# Patient Record
Sex: Male | Born: 1950 | Race: Black or African American | Hispanic: No | Marital: Married | Smoking: Current every day smoker
Health system: Southern US, Community
[De-identification: ages and names within clinical notes are randomized; demographics above are authoritative.]

---

## 2005-09-02 HISTORY — PX: LYSIS OF ADHESION: SHX5961

## 2018-09-02 HISTORY — PX: ABDOMINAL SURGERY: SHX537

## 2019-09-08 ENCOUNTER — Ambulatory Visit (INDEPENDENT_AMBULATORY_CARE_PROVIDER_SITE_OTHER): Payer: Medicare (Managed Care) | Admitting: Gastroenterology

## 2019-09-08 ENCOUNTER — Other Ambulatory Visit: Payer: Self-pay

## 2019-09-08 ENCOUNTER — Encounter (INDEPENDENT_AMBULATORY_CARE_PROVIDER_SITE_OTHER): Payer: Self-pay | Admitting: Gastroenterology

## 2019-09-08 VITALS — BP 172/85 | HR 80 | Temp 97.2°F | Ht 71.0 in | Wt 138.6 lb

## 2019-09-08 DIAGNOSIS — E876 Hypokalemia: Secondary | ICD-10-CM | POA: Diagnosis not present

## 2019-09-08 NOTE — Patient Instructions (Signed)
I am requesting records from [facility].   Your potassium was low at discharge - repeat potassium today, will call w/ results.

## 2019-09-08 NOTE — Progress Notes (Signed)
Patient profile: Stuart Watts is a 69 y.o. male seen for evaluation of recurrent SBO. Seen in follow up after discharge from [facility] 2 days ago.   History of Present Illness: Stuart Watts is seen today for follow-up of recurrent bowel obstructions. Wife accompanies and helps w/ hx.   Obtaining history somewhat limited by lack of records.    In summary patient reports having surgery for small bowel obstruction in 2007 of unknown cause ("something was a golf ball size and they took it out" per patient, no abd surgeries prior to that.  He did well for about 13 years until he had a recurrent SBO in August 2020 and had surgery in [place] felt related to adhesions.  He moved to [place] this fall due to the Covid pandemic.  Around Thanksgiving 2020 he developed issues with not moving his stools well, indigestion, bloating, worsened and was admitted for several days with conservative measures including bowel rest, NGT, etc & resolved without surgical intervention.  Reports as long as he was taking prune juice and stool softener he did well between November and late December 2020.  He did stop his stool softener and prune juice and developed another episode of constipation followed by abdominal pain and nausea vomiting.  Additionally admitted 09/02/2019 to 09/06/2019 for recurrent SBO at [facility] and responded well to conservative measures.  He reports the past 2 days since discharge she is feeling better and is tolerating liquids. He has started drinking ensures.  During episodes of small bowel obstruction main symptoms include bloating, nausea, abd pain.  Between episodes he has some early satiety but otherwise feels well.  Has lost from approximately 170 pounds 1 to 2 years ago to 133 pounds today.  He reports having a colonoscopy about 4 years ago in [place] that was normal.  He has never had an EGD.  He does not drink alcohol.  He smokes half pack a day.  He does not use NSAIDs  he denies any family history of colon polyps, colon cancer, IBD.  Wt Readings from Last 3 Encounters:  09/08/19 138 lb 9.6 oz (62.9 kg)     Last Colonoscopy: Per patient 4 years ago-we will request from [place]. Last Endoscopy: none prior    Past Medical History:  Patient denies past medical history.  He currently does not take any medications. BP high today but denies long term dx HTN.   Problem List: There are no problems to display for this patient.   Past Surgical History: Per patient 2007 in [place] for small bowel obstruction of unknown cause August 2020 in [place] for small bowel obstruction r/t adhesions.   Allergies: Not on File    Home Medications: No current outpatient medications on file. States he does not take any meds except OTC stool softener prn.   Family History: family history includes Diabetes in his father.  States mother died of old age.  Social History:   reports that he has been smoking cigarettes and cigars. He has been smoking about 0.50 packs per day. He has never used smokeless tobacco. He reports that he does not drink alcohol or use drugs.   Review of Systems: Constitutional:+ weight loss Eyes: No changes in vision. ENT: No oral lesions, sore throat.  GI: see HPI.  Heme/Lymph: No easy bruising.  CV: No chest pain.  GU: No hematuria.  Integumentary: No rashes.  Neuro: No headaches.  Psych: No depression/anxiety.  Endocrine: No  heat/cold intolerance.  Allergic/Immunologic: No urticaria.  Resp: No cough, SOB.  Musculoskeletal: No joint swelling.    Physical Examination: BP (!) 172/85 (BP Location: Right Arm, Patient Position: Sitting, Cuff Size: Large)   Pulse 80   Temp (!) 97.2 F (36.2 C) (Temporal)   Ht 5\' 11"  (1.803 m)   Wt 138 lb 9.6 oz (62.9 kg)   BMI 19.33 kg/m  Gen: NAD, alert and oriented x 4 HEENT: PEERLA, EOMI, Neck: supple, no JVD Chest: CTA bilaterally, no wheezes, crackles, or other adventitious  sounds CV: RRR, no m/g/c/r Abd: large midline abd scar, soft, NT, ND, +BS in all four quadrants; no HSM, guarding, ridigity, or rebound tenderness Ext: no edema, well perfused with 2+ pulses, Skin: no rash or lesions noted on observed skin Lymph: no noted LAD  Data:  CT scan from [facility] impression (to be scanned into chart) - 09/03/19--recurrent high-grade small bowel obstruction with transition point central abdomen likely secondary to adhesion, marked duodenal distention as well as significant small bowel dilation without significant mesenteric inflammation similar to prior exam suggesting element of underlying chronic bowel dilation.  Labs at discharge 09/04/2018-platelets 119, MCV 102, hemoglobin 12.5, potassium 2.9, albumin 3.0.  Abdominal x-ray 09/06/2019-improved but not completely normalized bowel gas pattern compared to CT 09/03/2019, NG tube appears stable.  Disproportionate amount of small bowel gas remains.    Assessment/Plan: Stuart Watts is a 69 y.o. male  Stuart Watts was seen today for new patient (initial visit).  Diagnoses and all orders for this visit:  Hypokalemia -     Basic Metabolic Panel (BMET)    History of recurrent small bowel obstructions-CT as above.  Requesting records from [place] for clarification. Dr [place] to review CT images from [facility] and likely will need to establish with surgery locally. Recommend low residue diet in interim.   Hypokalemia-potassium 2.9 at discharge earlier this week.  Will repeat today now that he is drinking more fluid since he is home. If stays low may need to start low dose K+ supplement.   Hypertension-blood pressure elevated in the office.  He denies ever being on blood pressure medication.  Recommended he monitor this at home and notify PCP if continued use to be elevated.  Case discussed w/ Dr [facility] who will review CT scan images.   I personally performed the service, non-incident to. (WP)  45 min total pt care - >50% face  to face. Extensive record review. Requested additional records from [place].     [place], PA-C [facility]

## 2019-09-09 LAB — BASIC METABOLIC PANEL
BUN/Creatinine Ratio: 7 (calc) (ref 6–22)
BUN: 6 mg/dL — ABNORMAL LOW (ref 7–25)
CO2: 30 mmol/L (ref 20–32)
Calcium: 9 mg/dL (ref 8.6–10.3)
Chloride: 103 mmol/L (ref 98–110)
Creat: 0.89 mg/dL (ref 0.70–1.18)
Glucose, Bld: 93 mg/dL (ref 65–139)
Potassium: 3.6 mmol/L (ref 3.5–5.3)
Sodium: 141 mmol/L (ref 135–146)

## 2019-09-21 ENCOUNTER — Telehealth (INDEPENDENT_AMBULATORY_CARE_PROVIDER_SITE_OTHER): Payer: Self-pay | Admitting: Internal Medicine

## 2019-09-21 NOTE — Telephone Encounter (Signed)
Did we get his op note records? We received some records and I reviewed with Dr Karilyn Cota but we requested additional records because they only sent EGD/colonoscopy records.

## 2019-09-21 NOTE — Telephone Encounter (Signed)
Patient left message stating he would like to know the results after receiving records from [place] - please advise - ph# [phone]

## 2019-09-27 ENCOUNTER — Telehealth (INDEPENDENT_AMBULATORY_CARE_PROVIDER_SITE_OTHER): Payer: Self-pay | Admitting: Gastroenterology

## 2019-09-27 NOTE — Telephone Encounter (Signed)
Patient would like a call back at [phone]

## 2019-09-27 NOTE — Telephone Encounter (Signed)
Called patient. No answer and VM full. Unable to leave message.   Will try again later in week.

## 2019-09-28 ENCOUNTER — Telehealth (INDEPENDENT_AMBULATORY_CARE_PROVIDER_SITE_OTHER): Payer: Self-pay | Admitting: Gastroenterology

## 2019-09-28 NOTE — Telephone Encounter (Signed)
Patient called wanted to know what the next steps are for his treatment - he can be reached at [phone] or [phone]

## 2019-09-29 NOTE — Telephone Encounter (Signed)
I discussed patient's symptoms with him this morning, he is overall doing well.  He is taking Colace, prune juice. & Align and moving stools well. No abd pain. Has gained weight.  He feels that he is feeling well and does not want to be referred to surgeon at this time.  We have requested his op note several times, we received CT results from [place] but never op note. I have sent an additional record release back.   He would like to see Dr Karilyn Cota this spring/summer, please schedule OV when available.   Reviewed importance of contacting me w/ symptoms in interim.

## 2019-10-04 DIAGNOSIS — K56609 Unspecified intestinal obstruction, unspecified as to partial versus complete obstruction: Secondary | ICD-10-CM

## 2019-10-04 HISTORY — DX: Unspecified intestinal obstruction, unspecified as to partial versus complete obstruction: K56.609

## 2019-10-06 ENCOUNTER — Ambulatory Visit (INDEPENDENT_AMBULATORY_CARE_PROVIDER_SITE_OTHER): Payer: Self-pay | Admitting: Gastroenterology

## 2019-10-14 ENCOUNTER — Other Ambulatory Visit: Payer: Self-pay

## 2019-10-14 ENCOUNTER — Encounter (HOSPITAL_COMMUNITY): Payer: Self-pay | Admitting: *Deleted

## 2019-10-14 ENCOUNTER — Inpatient Hospital Stay (HOSPITAL_COMMUNITY)
Admission: EM | Admit: 2019-10-14 | Discharge: 2019-10-16 | DRG: 389 | Disposition: A | Discharge status: Home / Self Care | Payer: Medicare Other | Attending: General Surgery | Admitting: General Surgery

## 2019-10-14 DIAGNOSIS — Z4659 Encounter for fitting and adjustment of other gastrointestinal appliance and device: Secondary | ICD-10-CM

## 2019-10-14 DIAGNOSIS — F17210 Nicotine dependence, cigarettes, uncomplicated: Secondary | ICD-10-CM | POA: Diagnosis present

## 2019-10-14 DIAGNOSIS — Z20822 Contact with and (suspected) exposure to covid-19: Secondary | ICD-10-CM | POA: Diagnosis present

## 2019-10-14 DIAGNOSIS — Z8719 Personal history of other diseases of the digestive system: Secondary | ICD-10-CM

## 2019-10-14 DIAGNOSIS — Z681 Body mass index (BMI) 19 or less, adult: Secondary | ICD-10-CM

## 2019-10-14 DIAGNOSIS — Z9119 Patient's noncompliance with other medical treatment and regimen: Secondary | ICD-10-CM

## 2019-10-14 DIAGNOSIS — Z5329 Procedure and treatment not carried out because of patient's decision for other reasons: Secondary | ICD-10-CM

## 2019-10-14 DIAGNOSIS — E440 Moderate protein-calorie malnutrition: Secondary | ICD-10-CM | POA: Diagnosis present

## 2019-10-14 DIAGNOSIS — K56609 Unspecified intestinal obstruction, unspecified as to partial versus complete obstruction: Secondary | ICD-10-CM

## 2019-10-14 DIAGNOSIS — Z0189 Encounter for other specified special examinations: Secondary | ICD-10-CM

## 2019-10-14 DIAGNOSIS — Z91199 Patient's noncompliance with other medical treatment and regimen due to unspecified reason: Secondary | ICD-10-CM

## 2019-10-14 DIAGNOSIS — K5651 Intestinal adhesions [bands], with partial obstruction: Secondary | ICD-10-CM | POA: Diagnosis not present

## 2019-10-14 DIAGNOSIS — F17290 Nicotine dependence, other tobacco product, uncomplicated: Secondary | ICD-10-CM | POA: Diagnosis present

## 2019-10-14 LAB — COMPREHENSIVE METABOLIC PANEL
ALT: 32 U/L (ref 0–44)
AST: 20 U/L (ref 15–41)
Albumin: 4 g/dL (ref 3.5–5.0)
Alkaline Phosphatase: 75 U/L (ref 38–126)
Anion gap: 7 (ref 5–15)
BUN: 15 mg/dL (ref 8–23)
CO2: 28 mmol/L (ref 22–32)
Calcium: 8.9 mg/dL (ref 8.9–10.3)
Chloride: 109 mmol/L (ref 98–111)
Creatinine, Ser: 1.06 mg/dL (ref 0.61–1.24)
GFR calc Af Amer: >60 mL/min (ref >60)
GFR calc non Af Amer: >60 mL/min (ref >60)
Glucose, Bld: 125 mg/dL — ABNORMAL HIGH (ref 70–99)
Potassium: 3.5 mmol/L (ref 3.5–5.1)
Sodium: 144 mmol/L (ref 135–145)
Total Bilirubin: 1.7 mg/dL — ABNORMAL HIGH (ref 0.3–1.2)
Total Protein: 6.8 g/dL (ref 6.5–8.1)

## 2019-10-14 LAB — URINALYSIS, ROUTINE W REFLEX MICROSCOPIC
Bilirubin Urine: NEGATIVE
Glucose, UA: NEGATIVE mg/dL
Hgb urine dipstick: NEGATIVE
Ketones, ur: 5 mg/dL — AB
Leukocytes,Ua: NEGATIVE
Nitrite: NEGATIVE
Protein, ur: 30 mg/dL — AB
Specific Gravity, Urine: >1.046 — ABNORMAL HIGH (ref 1.005–1.030)
pH: 5 (ref 5.0–8.0)

## 2019-10-14 LAB — CBC
HCT: 40.2 % (ref 39.0–52.0)
Hemoglobin: 13.6 g/dL (ref 13.0–17.0)
MCH: 32.9 pg (ref 26.0–34.0)
MCHC: 33.8 g/dL (ref 30.0–36.0)
MCV: 97.1 fL (ref 80.0–100.0)
Platelets: 195 10*3/uL (ref 150–400)
RBC: 4.14 MIL/uL — ABNORMAL LOW (ref 4.22–5.81)
RDW: 14 % (ref 11.5–15.5)
WBC: 6.9 10*3/uL (ref 4.0–10.5)
nRBC: 0 % (ref 0.0–0.2)

## 2019-10-14 LAB — LIPASE, BLOOD: Lipase: 16 U/L (ref 11–51)

## 2019-10-14 MED ORDER — SODIUM CHLORIDE 0.9% FLUSH
3.0000 mL | Freq: Once | INTRAVENOUS | Status: AC
  Administered 2019-10-15: 3 mL via INTRAVENOUS

## 2019-10-14 NOTE — ED Triage Notes (Signed)
Pt reports since Wednesday he has had abdominal swelling, nausea, and last BM Wed. Reports hx of "intestinal blockage" with surgery.

## 2019-10-15 ENCOUNTER — Inpatient Hospital Stay (HOSPITAL_COMMUNITY): Payer: Medicare Other

## 2019-10-15 ENCOUNTER — Emergency Department (HOSPITAL_COMMUNITY): Payer: Medicare Other

## 2019-10-15 ENCOUNTER — Encounter (HOSPITAL_COMMUNITY): Payer: Self-pay | Admitting: Emergency Medicine

## 2019-10-15 DIAGNOSIS — Z20822 Contact with and (suspected) exposure to covid-19: Secondary | ICD-10-CM | POA: Diagnosis present

## 2019-10-15 DIAGNOSIS — F17290 Nicotine dependence, other tobacco product, uncomplicated: Secondary | ICD-10-CM | POA: Diagnosis present

## 2019-10-15 DIAGNOSIS — K5651 Intestinal adhesions [bands], with partial obstruction: Secondary | ICD-10-CM | POA: Diagnosis present

## 2019-10-15 DIAGNOSIS — K56609 Unspecified intestinal obstruction, unspecified as to partial versus complete obstruction: Secondary | ICD-10-CM | POA: Diagnosis present

## 2019-10-15 DIAGNOSIS — E440 Moderate protein-calorie malnutrition: Secondary | ICD-10-CM | POA: Diagnosis present

## 2019-10-15 DIAGNOSIS — Z681 Body mass index (BMI) 19 or less, adult: Secondary | ICD-10-CM | POA: Diagnosis not present

## 2019-10-15 DIAGNOSIS — Z9119 Patient's noncompliance with other medical treatment and regimen: Secondary | ICD-10-CM | POA: Diagnosis not present

## 2019-10-15 DIAGNOSIS — F17210 Nicotine dependence, cigarettes, uncomplicated: Secondary | ICD-10-CM | POA: Diagnosis present

## 2019-10-15 LAB — BASIC METABOLIC PANEL
Anion gap: 13 (ref 5–15)
BUN: 15 mg/dL (ref 8–23)
CO2: 25 mmol/L (ref 22–32)
Calcium: 8.7 mg/dL — ABNORMAL LOW (ref 8.9–10.3)
Chloride: 106 mmol/L (ref 98–111)
Creatinine, Ser: 1.16 mg/dL (ref 0.61–1.24)
GFR calc Af Amer: >60 mL/min (ref >60)
GFR calc non Af Amer: >60 mL/min (ref >60)
Glucose, Bld: 106 mg/dL — ABNORMAL HIGH (ref 70–99)
Potassium: 4 mmol/L (ref 3.5–5.1)
Sodium: 144 mmol/L (ref 135–145)

## 2019-10-15 LAB — CBC
HCT: 37 % — ABNORMAL LOW (ref 39.0–52.0)
Hemoglobin: 12.4 g/dL — ABNORMAL LOW (ref 13.0–17.0)
MCH: 32.9 pg (ref 26.0–34.0)
MCHC: 33.5 g/dL (ref 30.0–36.0)
MCV: 98.1 fL (ref 80.0–100.0)
Platelets: 178 10*3/uL (ref 150–400)
RBC: 3.77 MIL/uL — ABNORMAL LOW (ref 4.22–5.81)
RDW: 14.2 % (ref 11.5–15.5)
WBC: 4.5 10*3/uL (ref 4.0–10.5)
nRBC: 0 % (ref 0.0–0.2)

## 2019-10-15 LAB — HIV ANTIBODY (ROUTINE TESTING W REFLEX): HIV Screen 4th Generation wRfx: NONREACTIVE

## 2019-10-15 LAB — RESPIRATORY PANEL BY RT PCR (FLU A&B, COVID)
Influenza A by PCR: NEGATIVE
Influenza B by PCR: NEGATIVE
SARS Coronavirus 2 by RT PCR: NEGATIVE

## 2019-10-15 MED ORDER — ENOXAPARIN SODIUM 40 MG/0.4ML ~~LOC~~ SOLN
40.0000 mg | SUBCUTANEOUS | Status: DC
  Administered 2019-10-15 – 2019-10-16 (×2): 40 mg via SUBCUTANEOUS
  Filled 2019-10-15 (×2): qty 0.4

## 2019-10-15 MED ORDER — DIATRIZOATE MEGLUMINE & SODIUM 66-10 % PO SOLN
90.0000 mL | Freq: Once | ORAL | Status: AC
  Administered 2019-10-15: 90 mL via NASOGASTRIC
  Filled 2019-10-15: qty 90

## 2019-10-15 MED ORDER — ONDANSETRON HCL 4 MG/2ML IJ SOLN
4.0000 mg | Freq: Four times a day (QID) | INTRAMUSCULAR | Status: DC | PRN
  Administered 2019-10-15 – 2019-10-16 (×2): 4 mg via INTRAVENOUS
  Filled 2019-10-15 (×2): qty 2

## 2019-10-15 MED ORDER — ONDANSETRON HCL 4 MG/2ML IJ SOLN
4.0000 mg | Freq: Once | INTRAMUSCULAR | Status: AC
  Administered 2019-10-15: 4 mg via INTRAVENOUS
  Filled 2019-10-15: qty 2

## 2019-10-15 MED ORDER — POTASSIUM CHLORIDE IN NACL 20-0.9 MEQ/L-% IV SOLN
INTRAVENOUS | Status: DC
  Filled 2019-10-15 (×3): qty 1000

## 2019-10-15 MED ORDER — BENZOCAINE 20 % MT AERO
INHALATION_SPRAY | Freq: Once | OROMUCOSAL | Status: AC
  Filled 2019-10-15: qty 57

## 2019-10-15 MED ORDER — DIPHENHYDRAMINE HCL 12.5 MG/5ML PO ELIX: 12.5000 mg | ORAL_SOLUTION | Freq: Four times a day (QID) | ORAL | Status: DC | PRN

## 2019-10-15 MED ORDER — ONDANSETRON 4 MG PO TBDP: 4.0000 mg | ORAL_TABLET | Freq: Four times a day (QID) | ORAL | Status: DC | PRN

## 2019-10-15 MED ORDER — DIPHENHYDRAMINE HCL 50 MG/ML IJ SOLN: 12.5000 mg | Freq: Four times a day (QID) | INTRAMUSCULAR | Status: DC | PRN

## 2019-10-15 MED ORDER — FENTANYL CITRATE (PF) 100 MCG/2ML IJ SOLN
100.0000 ug | Freq: Once | INTRAMUSCULAR | Status: AC
  Administered 2019-10-15: 100 ug via INTRAVENOUS
  Filled 2019-10-15: qty 2

## 2019-10-15 MED ORDER — IOHEXOL 300 MG/ML  SOLN
100.0000 mL | Freq: Once | INTRAMUSCULAR | Status: AC | PRN
  Administered 2019-10-15: 100 mL via INTRAVENOUS

## 2019-10-15 MED ORDER — MORPHINE SULFATE (PF) 2 MG/ML IV SOLN
2.0000 mg | INTRAVENOUS | Status: DC | PRN
  Administered 2019-10-16 (×2): 2 mg via INTRAVENOUS
  Filled 2019-10-15 (×2): qty 1

## 2019-10-15 NOTE — Progress Notes (Addendum)
Noted increase length of NGT out from pt's nose and gurgling sound from suction during shift change, clamped NGT and order for STAT abd xray verification for placement.  Abd xray showed no NGT tip seen on abdomen, still SBO though patient had type 6 BMx4 documented and no abd pain noted. Text paged floor coverage CCS and Dr; Stuart Watts called and instructed this RN to convince the need for the NGT to be pushed back.  Patient refused to have it pushed back at this time and wanted it out.  Told patient to call me if ever he is ready to have it reposition and a male RN will have to do it due to his traumatic experience in ED earlier this morning when a male ED RN pushed the NGT and he gagged and cough letting the NGT coiled and out.  Will monitor and follow it up.

## 2019-10-15 NOTE — ED Provider Notes (Addendum)
[facility] EMERGENCY DEPARTMENT Provider Note   CSN: [id number] Arrival date & time: 10/14/19  2050     History Chief Complaint  Patient presents with  . Abdominal Pain    Stuart Watts is a 69 y.o. male.  The history is provided by the patient.  Abdominal Pain Pain location:  Generalized Pain quality: dull   Pain radiates to:  Does not radiate Pain severity:  Moderate Onset quality:  Gradual Duration:  2 days Timing:  Constant Progression:  Unchanged Chronicity:  Recurrent Context: not eating, not laxative use, not retching, not sick contacts and not suspicious food intake   Relieved by:  Nothing Worsened by:  Nothing Ineffective treatments:  None tried Associated symptoms: anorexia and constipation   Associated symptoms: no chest pain, no dysuria, no fatigue, no fever, no flatus, no hematemesis, no hematochezia, no hematuria, no melena, no nausea, no shortness of breath, no sore throat, no vaginal bleeding, no vaginal discharge and no vomiting   Risk factors: no alcohol abuse   Patient with h/o SBO stopped his bowel regimen this week and now cannot pass stool.  Is passing flatus but hasn't eaten much since Wednesday when he ate steak.  Today had apple sauce.       History reviewed. No pertinent past medical history.  There are no problems to display for this patient.   Past Surgical History:  Procedure Laterality Date  . ABDOMINAL SURGERY         Family History  Problem Relation Age of Onset  . Diabetes Father     Social History   Tobacco Use  . Smoking status: Current Every Day Smoker    Packs/day: 0.50    Types: Cigarettes, Cigars  . Smokeless tobacco: Never Used  Substance Use Topics  . Alcohol use: Never  . Drug use: Never    Home Medications Prior to Admission medications   Not on File    Allergies    Patient has no known allergies.  Review of Systems   Review of Systems  Constitutional: Negative for fatigue  and fever.  HENT: Negative for sore throat.   Eyes: Negative for visual disturbance.  Respiratory: Negative for shortness of breath.   Cardiovascular: Negative for chest pain.  Gastrointestinal: Positive for abdominal pain, anorexia and constipation. Negative for flatus, hematemesis, hematochezia, melena, nausea and vomiting.  Genitourinary: Negative for dysuria, hematuria, vaginal bleeding and vaginal discharge.  Musculoskeletal: Negative for arthralgias.  Neurological: Negative for dizziness.  Psychiatric/Behavioral: Negative for agitation.  All other systems reviewed and are negative.   Physical Exam Updated Vital Signs BP (!) 153/80   Pulse 82   Temp 98.7 F (37.1 C) (Oral)   Resp 14   Ht 5\' 11"  (1.803 m)   Wt 59 kg   SpO2 99%   BMI 18.13 kg/m   Physical Exam Vitals and nursing note reviewed.  Constitutional:      General: He is not in acute distress.    Appearance: Normal appearance.  HENT:     Head: Normocephalic and atraumatic.     Nose: Nose normal.  Eyes:     Conjunctiva/sclera: Conjunctivae normal.     Pupils: Pupils are equal, round, and reactive to light.  Cardiovascular:     Rate and Rhythm: Normal rate and regular rhythm.     Pulses: Normal pulses.     Heart sounds: Normal heart sounds.  Pulmonary:     Effort: Pulmonary effort is normal.  Breath sounds: Normal breath sounds.  Abdominal:     General: Bowel sounds are decreased. There is distension.     Palpations: Abdomen is soft.     Tenderness: There is no abdominal tenderness. There is no guarding or rebound. Negative signs include Murphy's sign and McBurney's sign.     Hernia: No hernia is present.  Musculoskeletal:        General: Normal range of motion.     Cervical back: Normal range of motion and neck supple.  Skin:    General: Skin is warm and dry.     Capillary Refill: Capillary refill takes less than 2 seconds.  Neurological:     General: No focal deficit present.     Mental Status:  He is alert and oriented to person, place, and time.  Psychiatric:        Mood and Affect: Mood normal.        Behavior: Behavior normal.     ED Results / Procedures / Treatments   Labs (all labs ordered are listed, but only abnormal results are displayed) Results for orders placed or performed during the hospital encounter of 10/14/19  Respiratory Panel by RT PCR (Flu A&B, Covid) - Nasopharyngeal Swab   Specimen: Nasopharyngeal Swab  Result Value Ref Range   SARS Coronavirus 2 by RT PCR NEGATIVE NEGATIVE   Influenza A by PCR NEGATIVE NEGATIVE   Influenza B by PCR NEGATIVE NEGATIVE  Lipase, blood  Result Value Ref Range   Lipase 16 11 - 51 U/L  Comprehensive metabolic panel  Result Value Ref Range   Sodium 144 135 - 145 mmol/L   Potassium 3.5 3.5 - 5.1 mmol/L   Chloride 109 98 - 111 mmol/L   CO2 28 22 - 32 mmol/L   Glucose, Bld 125 (H) 70 - 99 mg/dL   BUN 15 8 - 23 mg/dL   Creatinine, Ser 1.06 0.61 - 1.24 mg/dL   Calcium 8.9 8.9 - 10.3 mg/dL   Total Protein 6.8 6.5 - 8.1 g/dL   Albumin 4.0 3.5 - 5.0 g/dL   AST 20 15 - 41 U/L   ALT 32 0 - 44 U/L   Alkaline Phosphatase 75 38 - 126 U/L   Total Bilirubin 1.7 (H) 0.3 - 1.2 mg/dL   GFR calc non Af Amer >60 >60 mL/min   GFR calc Af Amer >60 >60 mL/min   Anion gap 7 5 - 15  CBC  Result Value Ref Range   WBC 6.9 4.0 - 10.5 K/uL   RBC 4.14 (L) 4.22 - 5.81 MIL/uL   Hemoglobin 13.6 13.0 - 17.0 g/dL   HCT 40.2 39.0 - 52.0 %   MCV 97.1 80.0 - 100.0 fL   MCH 32.9 26.0 - 34.0 pg   MCHC 33.8 30.0 - 36.0 g/dL   RDW 14.0 11.5 - 15.5 %   Platelets 195 150 - 400 K/uL   nRBC 0.0 0.0 - 0.2 %  Urinalysis, Routine w reflex microscopic  Result Value Ref Range   Color, Urine YELLOW YELLOW   APPearance CLEAR CLEAR   Specific Gravity, Urine >1.046 (H) 1.005 - 1.030   pH 5.0 5.0 - 8.0   Glucose, UA NEGATIVE NEGATIVE mg/dL   Hgb urine dipstick NEGATIVE NEGATIVE   Bilirubin Urine NEGATIVE NEGATIVE   Ketones, ur 5 (A) NEGATIVE mg/dL    Protein, ur 30 (A) NEGATIVE mg/dL   Nitrite NEGATIVE NEGATIVE   Leukocytes,Ua NEGATIVE NEGATIVE   RBC / HPF 0-5 0 -  5 RBC/hpf   WBC, UA 0-5 0 - 5 WBC/hpf   Bacteria, UA RARE (A) NONE SEEN   Squamous Epithelial / LPF 0-5 0 - 5   Mucus PRESENT    No results found.  EKG EKG Interpretation  Date/Time:  Thursday October 14 2019 21:17:55 EST Ventricular Rate:  106 PR Interval:  134 QRS Duration: 80 QT Interval:  340 QTC Calculation: 451 R Axis:   76 Text Interpretation: Sinus tachycardia Confirmed by Nicanor Alcon, Larnce Schnackenberg ([id number]) on 10/14/2019 11:28:29 PM   Results for orders placed or performed during the hospital encounter of 10/14/19  Respiratory Panel by RT PCR (Flu A&B, Covid) - Nasopharyngeal Swab   Specimen: Nasopharyngeal Swab  Result Value Ref Range   SARS Coronavirus 2 by RT PCR NEGATIVE NEGATIVE   Influenza A by PCR NEGATIVE NEGATIVE   Influenza B by PCR NEGATIVE NEGATIVE  Lipase, blood  Result Value Ref Range   Lipase 16 11 - 51 U/L  Comprehensive metabolic panel  Result Value Ref Range   Sodium 144 135 - 145 mmol/L   Potassium 3.5 3.5 - 5.1 mmol/L   Chloride 109 98 - 111 mmol/L   CO2 28 22 - 32 mmol/L   Glucose, Bld 125 (H) 70 - 99 mg/dL   BUN 15 8 - 23 mg/dL   Creatinine, Ser 3.01 0.61 - 1.24 mg/dL   Calcium 8.9 8.9 - 60.1 mg/dL   Total Protein 6.8 6.5 - 8.1 g/dL   Albumin 4.0 3.5 - 5.0 g/dL   AST 20 15 - 41 U/L   ALT 32 0 - 44 U/L   Alkaline Phosphatase 75 38 - 126 U/L   Total Bilirubin 1.7 (H) 0.3 - 1.2 mg/dL   GFR calc non Af Amer >60 >60 mL/min   GFR calc Af Amer >60 >60 mL/min   Anion gap 7 5 - 15  CBC  Result Value Ref Range   WBC 6.9 4.0 - 10.5 K/uL   RBC 4.14 (L) 4.22 - 5.81 MIL/uL   Hemoglobin 13.6 13.0 - 17.0 g/dL   HCT 09.3 23.5 - 57.3 %   MCV 97.1 80.0 - 100.0 fL   MCH 32.9 26.0 - 34.0 pg   MCHC 33.8 30.0 - 36.0 g/dL   RDW 22.0 25.4 - 27.0 %   Platelets 195 150 - 400 K/uL   nRBC 0.0 0.0 - 0.2 %  Urinalysis, Routine w reflex microscopic    Result Value Ref Range   Color, Urine YELLOW YELLOW   APPearance CLEAR CLEAR   Specific Gravity, Urine >1.046 (H) 1.005 - 1.030   pH 5.0 5.0 - 8.0   Glucose, UA NEGATIVE NEGATIVE mg/dL   Hgb urine dipstick NEGATIVE NEGATIVE   Bilirubin Urine NEGATIVE NEGATIVE   Ketones, ur 5 (A) NEGATIVE mg/dL   Protein, ur 30 (A) NEGATIVE mg/dL   Nitrite NEGATIVE NEGATIVE   Leukocytes,Ua NEGATIVE NEGATIVE   RBC / HPF 0-5 0 - 5 RBC/hpf   WBC, UA 0-5 0 - 5 WBC/hpf   Bacteria, UA RARE (A) NONE SEEN   Squamous Epithelial / LPF 0-5 0 - 5   Mucus PRESENT    CT ABDOMEN PELVIS W CONTRAST  Result Date: 10/15/2019 CLINICAL DATA:  High-grade partial small bowel obstruction EXAM: CT ABDOMEN AND PELVIS WITH CONTRAST TECHNIQUE: Multidetector CT imaging of the abdomen and pelvis was performed using the standard protocol following bolus administration of intravenous contrast. CONTRAST:  OMNIPAQUE IOHEXOL 300 MG/ML  SOLN COMPARISON:  October 14, 2019 FINDINGS:  Lower chest: The visualized heart size within normal limits. No pericardial fluid/thickening. Again noted is a dilated distal esophagus contains fluid. Small bullous change seen at the left lung base. Hepatobiliary: Scattered small hypodense lesions are seen throughout the liver parenchyma the largest measuring 6 mm in the anterior left liver lobe.The main portal vein is patent. Contrast excretion seen within the gallbladder. Pancreas: Unremarkable. No pancreatic ductal dilatation or surrounding inflammatory changes. Spleen: Normal in size without focal abnormality. Adrenals/Urinary Tract: Both adrenal glands appear normal. Scattered low-density lesions are seen throughout both kidneys the largest measuring 2.5 cm within the midpole of the left kidney. No hydronephrosis. The bladder is contrast filled and unremarkable. Stomach/Bowel: The stomach appears to be partially decompressed in comparison to the prior. Again noted is markedly dilated air, fluid, and debris  filled small bowel seen throughout measuring up to 9.3 cm, previously up to 10 cm. There is been a prior surgical anastomosis seen within the left mid abdomen. The dilated loops are down to the level of the distal ileal loops which appear to be partially decompressed. No focal transition point however is noted. There is air and stool seen within the colon. There is mild question swirling of the mid mesentery at the mesenteric root, series 3, image 38. This was seen on prior and not changed in appearance. Vascular/Lymphatic: There are no enlarged mesenteric, retroperitoneal, or pelvic lymph nodes. Scattered aortic atherosclerotic calcifications are seen without aneurysmal dilatation. Reproductive: The prostate is unremarkable. Other: No evidence of abdominal wall mass or hernia. Trace free fluid seen within the deep pelvis. Musculoskeletal: No acute or significant osseous findings. IMPRESSION: 1. Again findings of a high-grade partial small bowel obstruction, slightly improved in appearance from the prior exam without a focal transition point. There does appear to be some mild swirling of the mid mesentery and this could be due to adhesions. 2. Trace free fluid in the deep pelvis. 3. Mildly dilated fluid filled distal esophagus. 4. Aortic Atherosclerosis (ICD10-I70.0). Electronically Signed   By: Jonna Clark M.D.   On: 10/15/2019 02:47    Procedures Procedures (including critical care time)  Medications Ordered in ED Medications  sodium chloride flush (NS) 0.9 % injection 3 mL (has no administration in time range)  fentaNYL (SUBLIMAZE) injection 100 mcg (has no administration in time range)  ondansetron (ZOFRAN) injection 4 mg (has no administration in time range)  iohexol (OMNIPAQUE) 300 MG/ML solution 100 mL (100 mLs Intravenous Contrast Given 10/15/19 0227)    ED Course  I have reviewed the triage vital signs and the nursing notes.  Pertinent labs & imaging results that were available during my  care of the patient were reviewed by me and considered in my medical decision making (see chart for details).  Case d/w Dr. Janee Morn who will see the patient.   Final Clinical Impression(s) / ED Diagnoses Final diagnoses:  None      Duey Liller, MD 10/15/19 2130    Nicanor Alcon, Kaely Hollan, MD 10/15/19 [id number]

## 2019-10-15 NOTE — H&P (Signed)
Stuart Watts is an 69 y.o. male.   Chief Complaint: SBO HPI: Patient has a history of previous small bowel obstructions.  He initially had surgery for small bowel obstruction in 2007.  He had another operation for small bowel obstruction in 2020.  That was done in [place].  Since that time, he has had several episodes of bowel obstructions which have resolved with medical therapy.  The most recent time he was admitted to [facility] on January 1 of this year.  His obstruction resolved with medical therapy.  He presented to the emergency department today with a 2-day history of obstipation and abdominal distention.  CT scan revealed small bowel obstruction and I was asked to see him for admission.  History reviewed. No pertinent past medical history.  Past Surgical History:  Procedure Laterality Date  . ABDOMINAL SURGERY      Family History  Problem Relation Age of Onset  . Diabetes Father    Social History:  reports that he has been smoking cigarettes and cigars. He has been smoking about 0.50 packs per day. He has never used smokeless tobacco. He reports that he does not drink alcohol or use drugs.  Allergies: No Known Allergies  (Not in a hospital admission)   Results for orders placed or performed during the hospital encounter of 10/14/19 (from the past 48 hour(s))  Lipase, blood     Status: None   Collection Time: 10/14/19  9:24 PM  Result Value Ref Range   Lipase 16 11 - 51 U/L    Comment: Performed at [facility], [address], [place], [place] [address]  Comprehensive metabolic panel     Status: Abnormal   Collection Time: 10/14/19  9:24 PM  Result Value Ref Range   Sodium 144 135 - 145 mmol/L   Potassium 3.5 3.5 - 5.1 mmol/L   Chloride 109 98 - 111 mmol/L   CO2 28 22 - 32 mmol/L   Glucose, Bld 125 (H) 70 - 99 mg/dL   BUN 15 8 - 23 mg/dL   Creatinine, Ser 2.35 0.61 - 1.24 mg/dL   Calcium 8.9 8.9 - 57.3 mg/dL   Total Protein 6.8 6.5 - 8.1 g/dL    Albumin 4.0 3.5 - 5.0 g/dL   AST 20 15 - 41 U/L   ALT 32 0 - 44 U/L   Alkaline Phosphatase 75 38 - 126 U/L   Total Bilirubin 1.7 (H) 0.3 - 1.2 mg/dL   GFR calc non Af Amer >60 >60 mL/min   GFR calc Af Amer >60 >60 mL/min   Anion gap 7 5 - 15    Comment: Performed at [facility], [address] [place], [place] [address]  CBC     Status: Abnormal   Collection Time: 10/14/19  9:24 PM  Result Value Ref Range   WBC 6.9 4.0 - 10.5 K/uL   RBC 4.14 (L) 4.22 - 5.81 MIL/uL   Hemoglobin 13.6 13.0 - 17.0 g/dL   HCT 42.7 06.2 - 37.6 %   MCV 97.1 80.0 - 100.0 fL   MCH 32.9 26.0 - 34.0 pg   MCHC 33.8 30.0 - 36.0 g/dL   RDW 28.3 15.1 - 76.1 %   Platelets 195 150 - 400 K/uL   nRBC 0.0 0.0 - 0.2 %    Comment: Performed at [facility], [address] [place], [place] [address]  Urinalysis, Routine w reflex microscopic     Status: Abnormal  Collection Time: 10/14/19  9:35 PM  Result Value Ref Range   Color, Urine YELLOW YELLOW   APPearance CLEAR CLEAR   Specific Gravity, Urine >1.046 (H) 1.005 - 1.030   pH 5.0 5.0 - 8.0   Glucose, UA NEGATIVE NEGATIVE mg/dL   Hgb urine dipstick NEGATIVE NEGATIVE   Bilirubin Urine NEGATIVE NEGATIVE   Ketones, ur 5 (A) NEGATIVE mg/dL   Protein, ur 30 (A) NEGATIVE mg/dL   Nitrite NEGATIVE NEGATIVE   Leukocytes,Ua NEGATIVE NEGATIVE   RBC / HPF 0-5 0 - 5 RBC/hpf   WBC, UA 0-5 0 - 5 WBC/hpf   Bacteria, UA RARE (A) NONE SEEN   Squamous Epithelial / LPF 0-5 0 - 5   Mucus PRESENT     Comment: Performed at [facility], [address] [place], [place] [address]  Respiratory Panel by RT PCR (Flu A&B, Covid) - Nasopharyngeal Swab     Status: None   Collection Time: 10/14/19 11:58 PM   Specimen: Nasopharyngeal Swab  Result Value Ref Range   SARS Coronavirus 2 by RT PCR NEGATIVE NEGATIVE    Comment: (NOTE) SARS-CoV-2 target nucleic acids are NOT DETECTED. The SARS-CoV-2 RNA is generally detectable in upper respiratoy specimens during the  acute phase of infection. The lowest concentration of SARS-CoV-2 viral copies this assay can detect is 131 copies/mL. A negative result does not preclude SARS-Cov-2 infection and should not be used as the sole basis for treatment or other patient management decisions. A negative result may occur with  improper specimen collection/handling, submission of specimen other than nasopharyngeal swab, presence of viral mutation(s) within the areas targeted by this assay, and inadequate number of viral copies (<131 copies/mL). A negative result must be combined with clinical observations, patient history, and epidemiological information. The expected result is Negative. Fact Sheet for Patients:  PinkCheek.be Fact Sheet for Healthcare Providers:  GravelBags.it This test is not yet ap proved or cleared by the [place] FDA and  has been authorized for detection and/or diagnosis of SARS-CoV-2 by FDA under an Emergency Use Authorization (EUA). This EUA will remain  in effect (meaning this test can be used) for the duration of the COVID-19 declaration under Section 564(b)(1) of the Act, 21 U.S.C. section 360bbb-3(b)(1), unless the authorization is terminated or revoked sooner.    Influenza A by PCR NEGATIVE NEGATIVE   Influenza B by PCR NEGATIVE NEGATIVE    Comment: (NOTE) The Xpert Xpress SARS-CoV-2/FLU/RSV assay is intended as an aid in  the diagnosis of influenza from Nasopharyngeal swab specimens and  should not be used as a sole basis for treatment. Nasal washings and  aspirates are unacceptable for Xpert Xpress SARS-CoV-2/FLU/RSV  testing. Fact Sheet for Patients: PinkCheek.be Fact Sheet for Healthcare Providers: GravelBags.it This test is not yet approved or cleared by the [place] FDA and  has been authorized for detection and/or diagnosis of SARS-CoV-2 by  FDA  under an Emergency Use Authorization (EUA). This EUA will remain  in effect (meaning this test can be used) for the duration of the  Covid-19 declaration under Section 564(b)(1) of the Act, 21  U.S.C. section 360bbb-3(b)(1), unless the authorization is  terminated or revoked. Performed at [facility], [address] [place], [place] [address]    CT ABDOMEN PELVIS W CONTRAST  Result Date: 10/15/2019 CLINICAL DATA:  High-grade partial small bowel obstruction EXAM: CT ABDOMEN AND PELVIS WITH CONTRAST TECHNIQUE: Multidetector CT imaging of the abdomen and pelvis was performed using the  standard protocol following bolus administration of intravenous contrast. CONTRAST:  OMNIPAQUE IOHEXOL 300 MG/ML  SOLN COMPARISON:  October 14, 2019 FINDINGS: Lower chest: The visualized heart size within normal limits. No pericardial fluid/thickening. Again noted is a dilated distal esophagus contains fluid. Small bullous change seen at the left lung base. Hepatobiliary: Scattered small hypodense lesions are seen throughout the liver parenchyma the largest measuring 6 mm in the anterior left liver lobe.The main portal vein is patent. Contrast excretion seen within the gallbladder. Pancreas: Unremarkable. No pancreatic ductal dilatation or surrounding inflammatory changes. Spleen: Normal in size without focal abnormality. Adrenals/Urinary Tract: Both adrenal glands appear normal. Scattered low-density lesions are seen throughout both kidneys the largest measuring 2.5 cm within the midpole of the left kidney. No hydronephrosis. The bladder is contrast filled and unremarkable. Stomach/Bowel: The stomach appears to be partially decompressed in comparison to the prior. Again noted is markedly dilated air, fluid, and debris filled small bowel seen throughout measuring up to 9.3 cm, previously up to 10 cm. There is been a prior surgical anastomosis seen within the left mid abdomen. The dilated loops are down to the  level of the distal ileal loops which appear to be partially decompressed. No focal transition point however is noted. There is air and stool seen within the colon. There is mild question swirling of the mid mesentery at the mesenteric root, series 3, image 38. This was seen on prior and not changed in appearance. Vascular/Lymphatic: There are no enlarged mesenteric, retroperitoneal, or pelvic lymph nodes. Scattered aortic atherosclerotic calcifications are seen without aneurysmal dilatation. Reproductive: The prostate is unremarkable. Other: No evidence of abdominal wall mass or hernia. Trace free fluid seen within the deep pelvis. Musculoskeletal: No acute or significant osseous findings. IMPRESSION: 1. Again findings of a high-grade partial small bowel obstruction, slightly improved in appearance from the prior exam without a focal transition point. There does appear to be some mild swirling of the mid mesentery and this could be due to adhesions. 2. Trace free fluid in the deep pelvis. 3. Mildly dilated fluid filled distal esophagus. 4. Aortic Atherosclerosis (ICD10-I70.0). Electronically Signed   By: Jonna Clark M.D.   On: 10/15/2019 02:47    Review of Systems  Constitutional: Negative.   HENT: Negative.   Eyes: Negative.   Respiratory: Negative.   Cardiovascular: Negative.   Gastrointestinal: Positive for abdominal distention and constipation.  Endocrine: Negative.   Genitourinary: Negative.   Musculoskeletal: Negative.   Skin: Negative.   Allergic/Immunologic: Negative.   Neurological: Negative.   Hematological: Negative.   Psychiatric/Behavioral: Negative.     Blood pressure (!) 173/81, pulse 80, temperature 98.7 F (37.1 C), temperature source Oral, resp. rate 15, height 5\' 11"  (1.803 m), weight 59 kg, SpO2 99 %. Physical Exam  Constitutional: He is oriented to person, place, and time. He appears well-developed and well-nourished. No distress.  HENT:  Head: Normocephalic.  Right  Ear: External ear normal.  Left Ear: External ear normal.  Mouth/Throat: Oropharynx is clear and moist.  Eyes: Pupils are equal, round, and reactive to light. EOM are normal. No scleral icterus.  Neck: No tracheal deviation present. No thyromegaly present.  Cardiovascular: Normal rate, regular rhythm, normal heart sounds and intact distal pulses.  Respiratory: Effort normal and breath sounds normal. No respiratory distress. He has no wheezes. He has no rales.  GI: Soft. Bowel sounds are normal. He exhibits distension. There is no abdominal tenderness. There is no rebound and no guarding.  Musculoskeletal:  General: No edema. Normal range of motion.     Cervical back: Neck supple.  Neurological: He is alert and oriented to person, place, and time.  Skin: Skin is dry.  Psychiatric: He has a normal mood and affect.  Alert and oriented     Assessment/Plan Partial small bowel obstruction -he has had several episodes.  No need for emergent surgical exploration.  Will admit and proceed with small bowel obstruction protocol.  NG tube is being placed in the emergency department.  I discussed the plan with him and answered his questions.  Liz Malady, MD 10/15/2019, 3:30 AM

## 2019-10-15 NOTE — Progress Notes (Signed)
Initial Nutrition Assessment  DOCUMENTATION CODES:   Underweight, Severe malnutrition in context of chronic illness  INTERVENTION:   -RD will follow for diet advancement and supplement diet as appropaite  NUTRITION DIAGNOSIS:   Severe Malnutrition related to chronic illness(chronic SBOs) as evidenced by severe fat depletion, severe muscle depletion.  GOAL:   Patient will meet greater than or equal to 90% of their needs  MONITOR:   Diet advancement, Labs, Weight trends, Skin, I & O's  REASON FOR ASSESSMENT:   Other (Comment)    ASSESSMENT:   Patient has a history of previous small bowel obstructions.  He initially had surgery for small bowel obstruction in 2007.  He had another operation for small bowel obstruction in 2020.  That was done in [place].  Since that time, he has had several episodes of bowel obstructions which have resolved with medical therapy.  The most recent time he was admitted to [facility] on January 1 of this year.  His obstruction resolved with medical therapy.  He presented to the emergency department today with a 2-day history of obstipation and abdominal distention.  CT scan revealed small bowel obstruction and I was asked to see him for admission.  Pt admitted with partial SBO.  2/12- NGT  Reviewed I/O's: +81 ml x 24 hours  Case discussed with RN prior to visit, who reports pt has been walking laps in hallways. He remains NPO with NGT. Pt undergoing gastrograffin study.   Spoke with pt at bedside, who was pleasant and in good spirits today. He reports ongoing weight loss since prior surgery in August 2020. Per pt, since that time, he has been eating constantly, however, "everything went in one end and out the other". Pt reports his UBW is around 175#, which he last weighed approximately 6 months ago. He estimates he has lost about 30-35# since that time period and is very concerned about this.Reviewed wt hx; pt has experienced a 4.3% wt loss over  the past month. While this is not significant for time frame, it is concerning given pt's nutrition history and underweight status.   Pt very anxious to eat ("I'm hungry and want some gingerale"). Discussed with pt purpose of NGT and rationale for diet order. Pt expressed concern about nutirtional status and desire to gain weight. He is amenable to oral nutrition supplements with diet advancement. Per pt, he had been drinking Ensure in the past, however, stopped about 3 weeks PTA "for no reason". Pt shares he could see that his weight improved when he drank Ensure and is very interested in adding it back with diet advancement. He also verbalizes that he will continue to drink them at home after discharge.   Medications reviewed and include 0.9% NaCl with KCl 20 mEq/L infusion @ 100 ml/hr.   Labs reviewed.   NUTRITION - FOCUSED PHYSICAL EXAM:    Most Recent Value  Orbital Region  Severe depletion  Upper Arm Region  Severe depletion  Thoracic and Lumbar Region  Severe depletion  Buccal Region  Severe depletion  Temple Region  Severe depletion  Clavicle Bone Region  Severe depletion  Clavicle and Acromion Bone Region  Severe depletion  Scapular Bone Region  Severe depletion  Dorsal Hand  Severe depletion  Patellar Region  Severe depletion  Anterior Thigh Region  Severe depletion  Posterior Calf Region  Severe depletion  Edema (RD Assessment)  None  Hair  Reviewed  Eyes  Reviewed  Mouth  Reviewed  Skin  Reviewed  Nails  Reviewed       Diet Order:   Diet Order            Diet NPO time specified Except for: Ice Chips  Diet effective now              EDUCATION NEEDS:   Education needs have been addressed  Skin:  Skin Assessment: Reviewed RN Assessment  Last BM:  Unknown  Height:   Ht Readings from Last 1 Encounters:  10/15/19 5' 11.5" (1.816 m)    Weight:   Wt Readings from Last 1 Encounters:  10/15/19 60.2 kg    Ideal Body Weight:  79.5 kg  BMI:  Body mass  index is 18.25 kg/m.  Estimated Nutritional Needs:   Kcal:  2200-2400  Protein:  120135 grams  Fluid:  > 2.2 L    Loistine Chance, RD, LDN, [place] Registered Dietitian II Certified Diabetes Care and Education Specialist Please refer to [facility] for RD and/or RD on-call/weekend/after hours pager

## 2019-10-15 NOTE — Progress Notes (Signed)
Pt given gastrographin infromed radiology  They will come at 6pm.

## 2019-10-15 NOTE — ED Notes (Signed)
  Went in to advance NG tube per MD request.  Started to advance tube and patient started coughing/gagging causing the NG tube to come up and curl into his mouth. NG was removed.  Attempted to re-insert NG but patient stated he needed a break and not right now.  Will notify MD

## 2019-10-15 NOTE — Progress Notes (Signed)
Patient ID: Stuart Watts, male   DOB: 05/22/1951, 69 y.o.   MRN: [id number] No bowel function or ab pain, nontender on exam About to get gg for protocol now

## 2019-10-15 NOTE — Progress Notes (Signed)
Patient refused x3 to have the NGT push back in /reposition and said not tonight.  Patient had another type 6 BM and ambulating around in room, denies any abd discomfort.  NGT still clamped.  Will monitor

## 2019-10-16 ENCOUNTER — Encounter (HOSPITAL_COMMUNITY): Payer: Self-pay | Admitting: General Surgery

## 2019-10-16 DIAGNOSIS — Z8719 Personal history of other diseases of the digestive system: Secondary | ICD-10-CM

## 2019-10-16 DIAGNOSIS — Z5329 Procedure and treatment not carried out because of patient's decision for other reasons: Secondary | ICD-10-CM

## 2019-10-16 DIAGNOSIS — E440 Moderate protein-calorie malnutrition: Secondary | ICD-10-CM

## 2019-10-16 DIAGNOSIS — Z91199 Patient's noncompliance with other medical treatment and regimen due to unspecified reason: Secondary | ICD-10-CM

## 2019-10-16 MED ORDER — MAGIC MOUTHWASH
15.0000 mL | Freq: Four times a day (QID) | ORAL | Status: DC | PRN
  Filled 2019-10-16: qty 15

## 2019-10-16 MED ORDER — LACTATED RINGERS IV BOLUS: 1000.0000 mL | Freq: Three times a day (TID) | INTRAVENOUS | Status: DC | PRN

## 2019-10-16 MED ORDER — SODIUM CHLORIDE 0.9% FLUSH
3.0000 mL | Freq: Two times a day (BID) | INTRAVENOUS | Status: DC
  Administered 2019-10-16: 3 mL via INTRAVENOUS

## 2019-10-16 MED ORDER — BISACODYL 10 MG RE SUPP: 10.0000 mg | Freq: Two times a day (BID) | RECTAL | Status: DC | PRN

## 2019-10-16 MED ORDER — ENALAPRILAT 1.25 MG/ML IV SOLN
0.6250 mg | Freq: Four times a day (QID) | INTRAVENOUS | Status: DC | PRN
  Filled 2019-10-16: qty 1

## 2019-10-16 MED ORDER — PROCHLORPERAZINE EDISYLATE 10 MG/2ML IJ SOLN: 5.0000 mg | INTRAMUSCULAR | Status: DC | PRN

## 2019-10-16 MED ORDER — ALUM & MAG HYDROXIDE-SIMETH 200-200-20 MG/5ML PO SUSP
30.0000 mL | Freq: Four times a day (QID) | ORAL | Status: DC | PRN
  Administered 2019-10-16: 30 mL via ORAL
  Filled 2019-10-16: qty 30

## 2019-10-16 MED ORDER — SODIUM CHLORIDE 0.9 % IV SOLN
8.0000 mg | Freq: Four times a day (QID) | INTRAVENOUS | Status: DC | PRN
  Filled 2019-10-16: qty 4

## 2019-10-16 MED ORDER — DIPHENHYDRAMINE HCL 50 MG/ML IJ SOLN: 12.5000 mg | Freq: Four times a day (QID) | INTRAMUSCULAR | Status: DC | PRN

## 2019-10-16 MED ORDER — SODIUM CHLORIDE 0.9% FLUSH: 3.0000 mL | INTRAVENOUS | Status: DC | PRN

## 2019-10-16 MED ORDER — METOPROLOL TARTRATE 5 MG/5ML IV SOLN
5.0000 mg | Freq: Four times a day (QID) | INTRAVENOUS | Status: DC | PRN
  Administered 2019-10-16: 5 mg via INTRAVENOUS
  Filled 2019-10-16: qty 5

## 2019-10-16 MED ORDER — SODIUM CHLORIDE 0.9 % IV SOLN: 250.0000 mL | INTRAVENOUS | Status: DC | PRN

## 2019-10-16 MED ORDER — NAPHAZOLINE-GLYCERIN 0.012-0.2 % OP SOLN
1.0000 [drp] | Freq: Four times a day (QID) | OPHTHALMIC | Status: DC | PRN
  Filled 2019-10-16: qty 15

## 2019-10-16 MED ORDER — METHOCARBAMOL 1000 MG/10ML IJ SOLN
1000.0000 mg | Freq: Four times a day (QID) | INTRAVENOUS | Status: DC | PRN
  Filled 2019-10-16: qty 10

## 2019-10-16 MED ORDER — LIP MEDEX EX OINT
1.0000 "application " | TOPICAL_OINTMENT | Freq: Two times a day (BID) | CUTANEOUS | Status: DC
  Filled 2019-10-16: qty 7

## 2019-10-16 MED ORDER — ONDANSETRON HCL 4 MG/2ML IJ SOLN: 4.0000 mg | Freq: Four times a day (QID) | INTRAMUSCULAR | Status: DC | PRN

## 2019-10-16 MED ORDER — SIMETHICONE 40 MG/0.6ML PO SUSP
40.0000 mg | Freq: Four times a day (QID) | ORAL | Status: DC | PRN
  Filled 2019-10-16: qty 0.6

## 2019-10-16 NOTE — Plan of Care (Signed)
Pt for discharge today , alert and oriented, ambulatory, tolerates his soft diet, given pain med, discontinued peripheral IV line, given health teachings, next appointment, due med explained and understood, waiting for his wife to pick him up.

## 2019-10-16 NOTE — Progress Notes (Signed)
Pt discharged per wheelchair, accompanied by NT. Wife waiting at the valet.

## 2019-10-16 NOTE — Progress Notes (Signed)
Received pt alert and oriented x4. Denies any pain. Discharge papers completed by previous nurse. Patient awaiting for wife to pick him up.

## 2019-10-16 NOTE — Progress Notes (Addendum)
Patient still refused to have the NGT advance despite explanation of the need to decompress his abdomen.  Patient stated that it made his stomach sick and insisted to have it removed.  Patient further stated that he will rather ambulate  than have the NGT.  This RN removed the pt's NGT upon pt's insistence. On call MD Lovick made aware.

## 2019-10-16 NOTE — Plan of Care (Signed)

## 2019-10-16 NOTE — Progress Notes (Signed)
Larna DaughtersDannie E Camilli [id number] [birth year]  CARE TEAM:  PCP: Patient, No Pcp Per  Outpatient Care Team: Patient Care Team: Patient, No Pcp Per as PCP - General (General Practice)  Inpatient Treatment Team: Treatment Team: Attending Provider: Montez Moritacs, Md, MD; Rounding Team: Montez Moritacs, Md, MD; Registered Nurse: Johnsie CancelSison, Celso L, RN; Registered Nurse: Reed BreechEstrera, Maria O, RN; Social Worker: Levada Schillingliver, Tara W, LCSWA   Problem List:   Active Problems:   SBO (small bowel obstruction) (HCC)   Protein-calorie malnutrition, moderate (HCC)   Noncompliance with NG tube   History of small bowel obstruction      * No surgery found *      Assessment  Recurrent small 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  Resolving nonoperatively so far  [facility](Hospital Stay = 1 days)  Plan:  -Clear liquids.  Advance to dysphagia 1/full's as tolerated -Medlock IV fluids with as needed boluses. -If has worsening pain or distention, reconsider nasogastric tube.  He was hesitant to have it placed and insisted on removed last night. -VTE prophylaxis- SCDs, etc -mobilize as tolerated to help recovery  20 minutes spent in review, evaluation, examination, counseling, and coordination of care.  More than 50% of that time was spent in counseling.  I updated the patient's status to the patient and nurse.  Recommendations were made.  Questions were answered.  They expressed understanding & appreciation.   10/16/2019    Subjective: (Chief complaint)  Insisted on getting NG tube removed last night.  Had a lot of loose bowel movement/diarrhea.  Feeling much better.  Thirsty/hungry.  Objective:  Vital signs:  Vitals:   10/15/19 0545 10/15/19 2001 10/15/19 2345 10/16/19 0349  BP: (!) 162/77 (!) 157/78 (!) 160/86 (!) 163/80  Pulse: 83 98 64 76  Resp: 18     Temp: 97.7 F (36.5 C) 98.5 F (36.9 C) 98.3 F (36.8 C) 98.3 F (36.8 C)  TempSrc: Oral  Oral Oral  SpO2: 100% 99% 100% 100%  Weight: 60.2 kg       Height: 5' 11.5" (1.816 m)       Last BM Date: 10/15/19  Intake/Output   Yesterday:  02/12 0701 - 02/13 0700 In: 2376.7 [I.V.:2316.7; NG/GT:60] Out: 150 [Emesis/NG output:150] This shift:  No intake/output data recorded.  Bowel function:  Flatus: YES  BM:  YES  Drain: (No drain)   Physical Exam:  General: Pt awake/alert in no acute distress Eyes: PERRL, normal EOM.  Sclera clear.  No icterus Neuro: CN II-XII intact w/o focal sensory/motor deficits. Lymph: No head/neck/groin lymphadenopathy Psych:  No delerium/psychosis/paranoia.  Oriented x 4 HENT: Normocephalic, Mucus membranes moist.  No thrush Neck: Supple, No tracheal deviation.  No obvious thyromegaly Chest: No pain to chest wall compression.  Good respiratory excursion.  No audible wheezing CV:  Pulses intact.  Regular rhythm.  No major extremity edema MS: Normal AROM mjr joints.  No obvious deformity  Abdomen: Soft.  Moderately distended.  Nontender.  No evidence of peritonitis.  No incarcerated hernias.  Ext:  No deformity.  No mjr edema.  No cyanosis Skin: No petechiae / purpurea.  No major sores.  Warm and dry    Results:   Cultures: Recent Results (from the past 720 hour(s))  Respiratory Panel by RT PCR (Flu A&B, Covid) - Nasopharyngeal Swab     Status: None   Collection Time: 10/14/19 11:58 PM   Specimen: Nasopharyngeal Swab  Result Value Ref Range Status   SARS Coronavirus 2 by RT PCR NEGATIVE  NEGATIVE Final    Comment: (NOTE) SARS-CoV-2 target nucleic acids are NOT DETECTED. The SARS-CoV-2 RNA is generally detectable in upper respiratoy specimens during the acute phase of infection. The lowest concentration of SARS-CoV-2 viral copies this assay can detect is 131 copies/mL. A negative result does not preclude SARS-Cov-2 infection and should not be used as the sole basis for treatment or other patient management decisions. A negative result may occur with  improper specimen collection/handling,  submission of specimen other than nasopharyngeal swab, presence of viral mutation(s) within the areas targeted by this assay, and inadequate number of viral copies (<131 copies/mL). A negative result must be combined with clinical observations, patient history, and epidemiological information. The expected result is Negative. Fact Sheet for Patients:  https://www.moore.com/ Fact Sheet for Healthcare Providers:  https://www.young.biz/ This test is not yet ap proved or cleared by the [place] FDA and  has been authorized for detection and/or diagnosis of SARS-CoV-2 by FDA under an Emergency Use Authorization (EUA). This EUA will remain  in effect (meaning this test can be used) for the duration of the COVID-19 declaration under Section 564(b)(1) of the Act, 21 U.S.C. section 360bbb-3(b)(1), unless the authorization is terminated or revoked sooner.    Influenza A by PCR NEGATIVE NEGATIVE Final   Influenza B by PCR NEGATIVE NEGATIVE Final    Comment: (NOTE) The Xpert Xpress SARS-CoV-2/FLU/RSV assay is intended as an aid in  the diagnosis of influenza from Nasopharyngeal swab specimens and  should not be used as a sole basis for treatment. Nasal washings and  aspirates are unacceptable for Xpert Xpress SARS-CoV-2/FLU/RSV  testing. Fact Sheet for Patients: https://www.moore.com/ Fact Sheet for Healthcare Providers: https://www.young.biz/ This test is not yet approved or cleared by the [place] FDA and  has been authorized for detection and/or diagnosis of SARS-CoV-2 by  FDA under an Emergency Use Authorization (EUA). This EUA will remain  in effect (meaning this test can be used) for the duration of the  Covid-19 declaration under Section 564(b)(1) of the Act, 21  U.S.C. section 360bbb-3(b)(1), unless the authorization is  terminated or revoked. Performed at [facility], [address]  [place], [place] [address]     Labs: Results for orders placed or performed during the hospital encounter of 10/14/19 (from the past 48 hour(s))  Lipase, blood     Status: None   Collection Time: 10/14/19  9:24 PM  Result Value Ref Range   Lipase 16 11 - 51 U/L    Comment: Performed at [facility], [address], [place], [place] [address]  Comprehensive metabolic panel     Status: Abnormal   Collection Time: 10/14/19  9:24 PM  Result Value Ref Range   Sodium 144 135 - 145 mmol/L   Potassium 3.5 3.5 - 5.1 mmol/L   Chloride 109 98 - 111 mmol/L   CO2 28 22 - 32 mmol/L   Glucose, Bld 125 (H) 70 - 99 mg/dL   BUN 15 8 - 23 mg/dL   Creatinine, Ser 3.57 0.61 - 1.24 mg/dL   Calcium 8.9 8.9 - 89.7 mg/dL   Total Protein 6.8 6.5 - 8.1 g/dL   Albumin 4.0 3.5 - 5.0 g/dL   AST 20 15 - 41 U/L   ALT 32 0 - 44 U/L   Alkaline Phosphatase 75 38 - 126 U/L   Total Bilirubin 1.7 (H) 0.3 - 1.2 mg/dL   GFR calc non Af Amer >60 >60 mL/min   GFR calc  Af Amer >60 >60 mL/min   Anion gap 7 5 - 15    Comment: Performed at [facility], [address] [place], [place] [address]  CBC     Status: Abnormal   Collection Time: 10/14/19  9:24 PM  Result Value Ref Range   WBC 6.9 4.0 - 10.5 K/uL   RBC 4.14 (L) 4.22 - 5.81 MIL/uL   Hemoglobin 13.6 13.0 - 17.0 g/dL   HCT 28.0 03.4 - 91.7 %   MCV 97.1 80.0 - 100.0 fL   MCH 32.9 26.0 - 34.0 pg   MCHC 33.8 30.0 - 36.0 g/dL   RDW 91.5 05.6 - 97.9 %   Platelets 195 150 - 400 K/uL   nRBC 0.0 0.0 - 0.2 %    Comment: Performed at [facility], [address] [place], [place] [address]  Urinalysis, Routine w reflex microscopic     Status: Abnormal   Collection Time: 10/14/19  9:35 PM  Result Value Ref Range   Color, Urine YELLOW YELLOW   APPearance CLEAR CLEAR   Specific Gravity, Urine >1.046 (H) 1.005 - 1.030   pH 5.0 5.0 - 8.0   Glucose, UA NEGATIVE NEGATIVE mg/dL   Hgb urine dipstick NEGATIVE NEGATIVE   Bilirubin Urine NEGATIVE NEGATIVE    Ketones, ur 5 (A) NEGATIVE mg/dL   Protein, ur 30 (A) NEGATIVE mg/dL   Nitrite NEGATIVE NEGATIVE   Leukocytes,Ua NEGATIVE NEGATIVE   RBC / HPF 0-5 0 - 5 RBC/hpf   WBC, UA 0-5 0 - 5 WBC/hpf   Bacteria, UA RARE (A) NONE SEEN   Squamous Epithelial / LPF 0-5 0 - 5   Mucus PRESENT     Comment: Performed at [facility], [address], [place], [place] [address]  Respiratory Panel by RT PCR (Flu A&B, Covid) - Nasopharyngeal Swab     Status: None   Collection Time: 10/14/19 11:58 PM   Specimen: Nasopharyngeal Swab  Result Value Ref Range   SARS Coronavirus 2 by RT PCR NEGATIVE NEGATIVE    Comment: (NOTE) SARS-CoV-2 target nucleic acids are NOT DETECTED. The SARS-CoV-2 RNA is generally detectable in upper respiratoy specimens during the acute phase of infection. The lowest concentration of SARS-CoV-2 viral copies this assay can detect is 131 copies/mL. A negative result does not preclude SARS-Cov-2 infection and should not be used as the sole basis for treatment or other patient management decisions. A negative result may occur with  improper specimen collection/handling, submission of specimen other than nasopharyngeal swab, presence of viral mutation(s) within the areas targeted by this assay, and inadequate number of viral copies (<131 copies/mL). A negative result must be combined with clinical observations, patient history, and epidemiological information. The expected result is Negative. Fact Sheet for Patients:  https://www.moore.com/ Fact Sheet for Healthcare Providers:  https://www.young.biz/ This test is not yet ap proved or cleared by the [place] FDA and  has been authorized for detection and/or diagnosis of SARS-CoV-2 by FDA under an Emergency Use Authorization (EUA). This EUA will remain  in effect (meaning this test can be used) for the duration of the COVID-19 declaration under Section 564(b)(1) of the Act, 21  U.S.C. section 360bbb-3(b)(1), unless the authorization is terminated or revoked sooner.    Influenza A by PCR NEGATIVE NEGATIVE   Influenza B by PCR NEGATIVE NEGATIVE    Comment: (NOTE) The Xpert Xpress SARS-CoV-2/FLU/RSV assay is intended as an aid in  the diagnosis of influenza from Nasopharyngeal swab  specimens and  should not be used as a sole basis for treatment. Nasal washings and  aspirates are unacceptable for Xpert Xpress SARS-CoV-2/FLU/RSV  testing. Fact Sheet for Patients: PinkCheek.be Fact Sheet for Healthcare Providers: GravelBags.it This test is not yet approved or cleared by the [place] FDA and  has been authorized for detection and/or diagnosis of SARS-CoV-2 by  FDA under an Emergency Use Authorization (EUA). This EUA will remain  in effect (meaning this test can be used) for the duration of the  Covid-19 declaration under Section 564(b)(1) of the Act, 21  U.S.C. section 360bbb-3(b)(1), unless the authorization is  terminated or revoked. Performed at [facility], [place] [address] [place], [place] [address]   HIV Antibody (routine testing w rflx)     Status: None   Collection Time: 10/15/19  6:23 AM  Result Value Ref Range   HIV Screen 4th Generation wRfx NON REACTIVE NON REACTIVE    Comment: Performed at [place] [address], [place], [place] [address]  CBC     Status: Abnormal   Collection Time: 10/15/19  6:23 AM  Result Value Ref Range   WBC 4.5 4.0 - 10.5 K/uL   RBC 3.77 (L) 4.22 - 5.81 MIL/uL   Hemoglobin 12.4 (L) 13.0 - 17.0 g/dL   HCT 37.0 (L) 39.0 - 52.0 %   MCV 98.1 80.0 - 100.0 fL   MCH 32.9 26.0 - 34.0 pg   MCHC 33.5 30.0 - 36.0 g/dL   RDW 14.2 11.5 - 15.5 %   Platelets 178 150 - 400 K/uL   nRBC 0.0 0.0 - 0.2 %    Comment: Performed at [facility], [place] [address], [place], [place] [address]  Basic metabolic panel     Status: Abnormal   Collection Time:  10/15/19  6:23 AM  Result Value Ref Range   Sodium 144 135 - 145 mmol/L   Potassium 4.0 3.5 - 5.1 mmol/L   Chloride 106 98 - 111 mmol/L   CO2 25 22 - 32 mmol/L   Glucose, Bld 106 (H) 70 - 99 mg/dL   BUN 15 8 - 23 mg/dL   Creatinine, Ser 1.16 0.61 - 1.24 mg/dL   Calcium 8.7 (L) 8.9 - 10.3 mg/dL   GFR calc non Af Amer >60 >60 mL/min   GFR calc Af Amer >60 >60 mL/min   Anion gap 13 5 - 15    Comment: Performed at [facility] [address] [place], [place] [address]    Imaging / Studies: DG Abd 1 View  Result Date: 10/15/2019 CLINICAL DATA:  Nasogastric tube placement EXAM: ABDOMEN - 1 VIEW COMPARISON:  Abdominal CT from earlier today FINDINGS: Nasogastric tube with tip near the GE junction and side-port 7 cm above. Stomach is still gas distended. Lungs are clear and the heart size is unremarkable. IMPRESSION: Nasogastric tube with tip at the upper stomach or GE junction and side-port over the lower esophagus. 7 to 8 cm of advancement would provide more optimal positioning. Electronically Signed   By: Monte Fantasia M.D.   On: 10/15/2019 04:44   CT ABDOMEN PELVIS W CONTRAST  Result Date: 10/15/2019 CLINICAL DATA:  High-grade partial small bowel obstruction EXAM: CT ABDOMEN AND PELVIS WITH CONTRAST TECHNIQUE: Multidetector CT imaging of the abdomen and pelvis was performed using the standard protocol following bolus administration of intravenous contrast. CONTRAST:  116mL OMNIPAQUE IOHEXOL 300 MG/ML  SOLN COMPARISON:  October 14, 2019 FINDINGS:  Lower chest: The visualized heart size within normal limits. No pericardial fluid/thickening. Again noted is a dilated distal esophagus contains fluid. Small bullous change seen at the left lung base. Hepatobiliary: Scattered small hypodense lesions are seen throughout the liver parenchyma the largest measuring 6 mm in the anterior left liver lobe.The main portal vein is patent. Contrast excretion seen within the gallbladder. Pancreas:  Unremarkable. No pancreatic ductal dilatation or surrounding inflammatory changes. Spleen: Normal in size without focal abnormality. Adrenals/Urinary Tract: Both adrenal glands appear normal. Scattered low-density lesions are seen throughout both kidneys the largest measuring 2.5 cm within the midpole of the left kidney. No hydronephrosis. The bladder is contrast filled and unremarkable. Stomach/Bowel: The stomach appears to be partially decompressed in comparison to the prior. Again noted is markedly dilated air, fluid, and debris filled small bowel seen throughout measuring up to 9.3 cm, previously up to 10 cm. There is been a prior surgical anastomosis seen within the left mid abdomen. The dilated loops are down to the level of the distal ileal loops which appear to be partially decompressed. No focal transition point however is noted. There is air and stool seen within the colon. There is mild question swirling of the mid mesentery at the mesenteric root, series 3, image 38. This was seen on prior and not changed in appearance. Vascular/Lymphatic: There are no enlarged mesenteric, retroperitoneal, or pelvic lymph nodes. Scattered aortic atherosclerotic calcifications are seen without aneurysmal dilatation. Reproductive: The prostate is unremarkable. Other: No evidence of abdominal wall mass or hernia. Trace free fluid seen within the deep pelvis. Musculoskeletal: No acute or significant osseous findings. IMPRESSION: 1. Again findings of a high-grade partial small bowel obstruction, slightly improved in appearance from the prior exam without a focal transition point. There does appear to be some mild swirling of the mid mesentery and this could be due to adhesions. 2. Trace free fluid in the deep pelvis. 3. Mildly dilated fluid filled distal esophagus. 4. Aortic Atherosclerosis (ICD10-I70.0). Electronically Signed   By: Jonna Clark M.D.   On: 10/15/2019 02:47   DG Abd Portable 1V  Result Date:  10/15/2019 CLINICAL DATA:  Enteric catheter placement EXAM: PORTABLE ABDOMEN - 1 VIEW COMPARISON:  10/15/2019 FINDINGS: Frontal view of the chest and upper abdomen are obtained. I do not see an enteric catheter overlying the esophagus or stomach. Lungs are clear. Diffuse gaseous distension of the bowel again noted. IMPRESSION: 1. Enteric catheter is not identified on this exam. 2. Continued bowel obstruction. These results will be called to the ordering clinician or representative by the Radiologist Assistant, and communication documented in the PACS or zVision Dashboard. Electronically Signed   By: Sharlet Salina M.D.   On: 10/15/2019 20:23   DG Abd Portable 1V-Small Bowel Obstruction Protocol-initial, 8 hr delay  Result Date: 10/15/2019 CLINICAL DATA:  Small-bowel obstruction EXAM: PORTABLE ABDOMEN - 1 VIEW COMPARISON:  10/15/2019 7:33 a.m. FINDINGS: Two supine frontal views of the abdomen and pelvis demonstrate interval removal of the enteric catheter. There is increasing distention of the small bowel. Administered oral contrast accumulates within a dilated loop of bowel in the right mid abdomen, corresponding to a distended loop of small bowel seen on recent CT. I do not see any oral contrast definitively within the decompressed colon. Excreted urinary contrast from prior CT scan. IMPRESSION: 1. Progressive dilatation of the small bowel after enteric catheter removal. 2. Accumulation of oral contrast within the dilated loop of small bowel in the right mid abdomen, as seen  on preceding CT exam. No colonic contrast on this study. Electronically Signed   By: Sharlet Salina M.D.   On: 10/15/2019 18:16   DG Abd Portable 1V-Small Bowel Protocol-Position Verification  Result Date: 10/15/2019 CLINICAL DATA:  69 year old male NG tube placement. EXAM: PORTABLE ABDOMEN - 1 VIEW COMPARISON:  CT Abdomen and Pelvis 0235 hours today. FINDINGS: Portable AP view at 0733 hours. Enteric tube placed into the stomach with  side hole at the level of the gastric cardia. Negative lung bases. Excreted IV contrast in nondilated renal collecting systems and proximal ureters. Mildly improved bowel gas from earlier. No acute osseous abnormality identified. IMPRESSION: 1. Enteric tube placed into the stomach with satisfactory positioning. 2. Mildly improved bowel gas pattern from 0232 hours. Electronically Signed   By: Odessa Fleming M.D.   On: 10/15/2019 08:45    Medications / Allergies: per chart  Antibiotics: Anti-infectives (From admission, onward)   None        Note: Portions of this report may have been transcribed using voice recognition software. Every effort was made to ensure accuracy; however, inadvertent computerized transcription errors may be present.   Any transcriptional errors that result from this process are unintentional.     Ardeth Sportsman, MD, FACS, MASCRS Gastrointestinal and Minimally Invasive Surgery    [address] [place], [place] [address] [phone] Main / Paging [phone] Fax Please see Amion for pager number, especial 5pm - 7am.

## 2019-10-19 ENCOUNTER — Ambulatory Visit (INDEPENDENT_AMBULATORY_CARE_PROVIDER_SITE_OTHER): Payer: Medicare Other | Admitting: Internal Medicine

## 2019-10-19 ENCOUNTER — Other Ambulatory Visit: Payer: Self-pay

## 2019-10-19 ENCOUNTER — Encounter (INDEPENDENT_AMBULATORY_CARE_PROVIDER_SITE_OTHER): Payer: Self-pay | Admitting: Internal Medicine

## 2019-10-19 DIAGNOSIS — K6389 Other specified diseases of intestine: Secondary | ICD-10-CM

## 2019-10-19 DIAGNOSIS — K56699 Other intestinal obstruction unspecified as to partial versus complete obstruction: Secondary | ICD-10-CM

## 2019-10-19 MED ORDER — TRAMADOL HCL 50 MG PO TABS: 50.0000 mg | ORAL_TABLET | Freq: Three times a day (TID) | ORAL | 0 refills | Status: DC | PRN

## 2019-10-19 MED ORDER — SIMETHICONE 180 MG PO CAPS: 180.0000 mg | ORAL_CAPSULE | Freq: Three times a day (TID) | ORAL | 0 refills | Status: DC | PRN

## 2019-10-19 MED ORDER — METRONIDAZOLE 250 MG PO TABS: 250.0000 mg | ORAL_TABLET | Freq: Three times a day (TID) | ORAL | 0 refills | Status: DC

## 2019-10-19 NOTE — Progress Notes (Signed)
Presenting complaint;  Follow-up for recurrent small bowel obstruction.  Database and subjective:  Patient is 69 year old Afro-American male who is here for scheduled visit accompanied by his wife Anner. Patient was last seen in our office by Ms. Laurine Blazer, PA-C on 09/08/2019 for symptoms of nausea bloating and abdominal pain. I was able to review patient's imaging studies performed at [facility] with Dr. Thornton Papas help in contacting the patient and recommended surgical consultation.  He had markedly dilated small bowel including jejunum with distal small bowel stricture. Patient wanted [place] to review his old records before he would make final decision. In the meantime he developed worsening abdominal pain nausea and vomiting and was seen at [facility] ER on 10/15/2019.  This study revealed evidence of prior surgery with anastomosis in the left mid abdomen dilated loops of small bowel to the level of distal ileum which is partially decompressed.  There was question of mild swirling of mid mesentery.  This also had been seen on prior study.  Patient was admitted to Dr. Biagio Borg service.  He was treated with NG suction.  Gastrografin study was planned.  However patient got upset and left the hospital to be admitted at [facility] where he was treated for a day or so and discharged.  Past GI history is as follows.  Patient's past history is significant for small bowel surgery back in 2007 apparently due to adhesions.   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  Colonoscopy was not completed([place]).  Patient apparently had another surgery according to his wife.  No details are available. His last surgery was in August 2020 at hospital in [place] where he had 13 cm of jejunum removed.  Report indicates that there is suture and small intestine was twisted.  Pathology revealed segment of small intestine showing 2 irregular defects in  the vault with congested vessels hemorrhage edema and focal serosal fibrinous inflammatory adhesions. Patient was admitted to [facility] in [place] on New Year's Eve for small bowel obstruction and treated with NG suction IV fluids and improved with therapy.  He was seen by surgical service but he declined to have any surgery.  Patient is presently on what he describes to be baby food.  He is passing softer loose stools.  He is also passing some flatus.  He is having intermittent mid and lower abdominal pain.  He is requesting pain medication.  He is afraid to have another surgery because his condition is recurred within few months of his surgery of August 2020.  He has lost another 6 pounds since he was seen in our office 5 weeks ago.  Patient reports his baseline weight to be between 167 and 170 pounds.  He denies fever chills hematemesis melena or rectal bleeding.  He has good appetite but he is afraid to eat.  He is hoping that he could be treated medically. He is retired.  He does not smoke cigarettes or drink alcohol.   Current Medications: Outpatient Encounter Medications as of 10/19/2019  Medication Sig  . Probiotic Product (ALIGN) 4 MG CAPS Take 4 mg by mouth daily.   No facility-administered encounter medications on file as of 10/19/2019.    Objective: Blood pressure (!) 168/84, pulse 92, temperature (!) 97.4 F (36.3 C), temperature source Temporal, height '5\' 11"'  (1.803 m), weight 132 lb 4.8 oz (60 kg). Patient is alert and in no acute distress. He is  wearing a facial mask. Conjunctiva is pink. Sclera is nonicteric Oropharyngeal mucosa is normal. No neck masses or thyromegaly noted. Cardiac exam with regular rhythm normal S1 and S2. No murmur or gallop noted. Lungs are clear to auscultation. Abdomen abdomen is distended but not tense.  Bowel sounds are present.  Percussion note is very templated.  He has vague tenderness in hypogastric region.  No organomegaly or  masses. No LE edema or clubbing noted.  Labs/studies Results:  CBC Latest Ref Rng & Units 10/15/2019 10/14/2019  WBC 4.0 - 10.5 K/uL 4.5 6.9  Hemoglobin 13.0 - 17.0 g/dL 12.4(L) 13.6  Hematocrit 39.0 - 52.0 % 37.0(L) 40.2  Platelets 150 - 400 K/uL 178 195    CMP Latest Ref Rng & Units 10/15/2019 10/14/2019 09/09/2019  Glucose 70 - 99 mg/dL 106(H) 125(H) 93  BUN 8 - 23 mg/dL 15 15 6(L)  Creatinine 0.61 - 1.24 mg/dL 1.16 1.06 0.89  Sodium 135 - 145 mmol/L 144 144 141  Potassium 3.5 - 5.1 mmol/L 4.0 3.5 3.6  Chloride 98 - 111 mmol/L 106 109 103  CO2 22 - 32 mmol/L '25 28 30  ' Calcium 8.9 - 10.3 mg/dL 8.7(L) 8.9 9.0  Total Protein 6.5 - 8.1 g/dL - 6.8 -  Total Bilirubin 0.3 - 1.2 mg/dL - 1.7(H) -  Alkaline Phos 38 - 126 U/L - 75 -  AST 15 - 41 U/L - 20 -  ALT 0 - 44 U/L - 32 -    Hepatic Function Latest Ref Rng & Units 10/14/2019  Total Protein 6.5 - 8.1 g/dL 6.8  Albumin 3.5 - 5.0 g/dL 4.0  AST 15 - 41 U/L 20  ALT 0 - 44 U/L 32  Alk Phosphatase 38 - 126 U/L 75  Total Bilirubin 0.3 - 1.2 mg/dL 1.7(H)    CT from 10/15/2019 reviewed and shared with patient. He has markedly dilated loops of small bowel with debris distally.  There is some gas in his colon which is not dilated.  Assessment:  #1.  Patient is suspected to high-grade distal small bowel stricture.  This appears to be chronic problem since his small bowel is markedly dilated with fecalization or debris in it.  The longer he waits worse the prognosis and may also result in slow recovery.  He may also have developed small intestinal bacterial overgrowth given his symptoms.  I am afraid he may need nutritional support as it may take a long time for his bowel function to return.  #2.  Weight loss of over 35 pounds in less than a year secondary to recurrent small bowel obstruction/high-grade stricture.   Plan:  Patient will continue low residue diet. Patient is agreeable to be referred back to [facility]. [facility].  He  regrets the fact that he left the hospital but appreciates that he was very satisfied with his care. Will make an appointment for him to see Dr. Grandville Silos or Dr. Donne Hazel as both of them had seen patient during recent hospitalization. Metronidazole to 50 mg by mouth 3 times a day for 10 days. Simethicone 180 mg by mouth 3 times a day as needed if it helps with bloating. Tramadol 50 mg p.o. 3 times daily as needed prescription given for 21 without a refill. Patient advised to report to emergency room at [facility] or [facility] if pain worsens or he develops vomiting. Office visit in 3 months.

## 2019-10-19 NOTE — Patient Instructions (Signed)
Will arrange for office visit with Dr. Janee Morn or Dr. Dwain Sarna of [facility] surgery for office visit. Please do not take pain medication unless pain moderate to severe. Please report to emergency room if you develop vomiting or pain not controlled with medication

## 2019-10-22 ENCOUNTER — Inpatient Hospital Stay (HOSPITAL_COMMUNITY)
Admission: EM | Admit: 2019-10-22 | Discharge: 2019-10-28 | DRG: 329 | Disposition: A | Discharge status: Home / Self Care | Payer: Medicare Other | Attending: General Surgery | Admitting: General Surgery

## 2019-10-22 ENCOUNTER — Other Ambulatory Visit: Payer: Self-pay

## 2019-10-22 ENCOUNTER — Inpatient Hospital Stay (HOSPITAL_COMMUNITY): Payer: Medicare Other

## 2019-10-22 ENCOUNTER — Telehealth (INDEPENDENT_AMBULATORY_CARE_PROVIDER_SITE_OTHER): Payer: Self-pay | Admitting: *Deleted

## 2019-10-22 ENCOUNTER — Emergency Department (HOSPITAL_COMMUNITY): Payer: Medicare Other

## 2019-10-22 ENCOUNTER — Encounter (HOSPITAL_COMMUNITY): Admission: EM | Disposition: A | Discharge status: Home / Self Care | Payer: Self-pay | Source: Home / Self Care

## 2019-10-22 ENCOUNTER — Inpatient Hospital Stay: Payer: Self-pay

## 2019-10-22 ENCOUNTER — Inpatient Hospital Stay (HOSPITAL_COMMUNITY): Payer: Medicare Other | Admitting: Certified Registered Nurse Anesthetist

## 2019-10-22 ENCOUNTER — Encounter (HOSPITAL_COMMUNITY): Payer: Self-pay | Admitting: Emergency Medicine

## 2019-10-22 DIAGNOSIS — Z681 Body mass index (BMI) 19 or less, adult: Secondary | ICD-10-CM

## 2019-10-22 DIAGNOSIS — Z20822 Contact with and (suspected) exposure to covid-19: Secondary | ICD-10-CM | POA: Diagnosis present

## 2019-10-22 DIAGNOSIS — F05 Delirium due to known physiological condition: Secondary | ICD-10-CM | POA: Diagnosis not present

## 2019-10-22 DIAGNOSIS — Z0189 Encounter for other specified special examinations: Secondary | ICD-10-CM

## 2019-10-22 DIAGNOSIS — Z9049 Acquired absence of other specified parts of digestive tract: Secondary | ICD-10-CM

## 2019-10-22 DIAGNOSIS — K565 Intestinal adhesions [bands], unspecified as to partial versus complete obstruction: Principal | ICD-10-CM | POA: Diagnosis present

## 2019-10-22 DIAGNOSIS — F17210 Nicotine dependence, cigarettes, uncomplicated: Secondary | ICD-10-CM | POA: Diagnosis present

## 2019-10-22 DIAGNOSIS — E43 Unspecified severe protein-calorie malnutrition: Secondary | ICD-10-CM | POA: Diagnosis present

## 2019-10-22 DIAGNOSIS — K56609 Unspecified intestinal obstruction, unspecified as to partial versus complete obstruction: Secondary | ICD-10-CM

## 2019-10-22 DIAGNOSIS — K567 Ileus, unspecified: Secondary | ICD-10-CM | POA: Diagnosis present

## 2019-10-22 DIAGNOSIS — Z833 Family history of diabetes mellitus: Secondary | ICD-10-CM | POA: Diagnosis not present

## 2019-10-22 HISTORY — PX: LYSIS OF ADHESION: SHX5961

## 2019-10-22 HISTORY — PX: LAPAROTOMY: SHX154

## 2019-10-22 LAB — COMPREHENSIVE METABOLIC PANEL
ALT: 18 U/L (ref 0–44)
AST: 15 U/L (ref 15–41)
Albumin: 4.3 g/dL (ref 3.5–5.0)
Alkaline Phosphatase: 76 U/L (ref 38–126)
Anion gap: 15 (ref 5–15)
BUN: 15 mg/dL (ref 8–23)
CO2: 28 mmol/L (ref 22–32)
Calcium: 9.6 mg/dL (ref 8.9–10.3)
Chloride: 100 mmol/L (ref 98–111)
Creatinine, Ser: 1.41 mg/dL — ABNORMAL HIGH (ref 0.61–1.24)
GFR calc Af Amer: 58 mL/min — ABNORMAL LOW (ref >60)
GFR calc non Af Amer: 50 mL/min — ABNORMAL LOW (ref >60)
Glucose, Bld: 161 mg/dL — ABNORMAL HIGH (ref 70–99)
Potassium: 3 mmol/L — ABNORMAL LOW (ref 3.5–5.1)
Sodium: 143 mmol/L (ref 135–145)
Total Bilirubin: 1 mg/dL (ref 0.3–1.2)
Total Protein: 7.6 g/dL (ref 6.5–8.1)

## 2019-10-22 LAB — RESPIRATORY PANEL BY RT PCR (FLU A&B, COVID)
Influenza A by PCR: NEGATIVE
Influenza B by PCR: NEGATIVE
SARS Coronavirus 2 by RT PCR: NEGATIVE

## 2019-10-22 LAB — CBC
HCT: 45.1 % (ref 39.0–52.0)
Hemoglobin: 15.3 g/dL (ref 13.0–17.0)
MCH: 32.8 pg (ref 26.0–34.0)
MCHC: 33.9 g/dL (ref 30.0–36.0)
MCV: 96.6 fL (ref 80.0–100.0)
Platelets: 224 10*3/uL (ref 150–400)
RBC: 4.67 MIL/uL (ref 4.22–5.81)
RDW: 13.4 % (ref 11.5–15.5)
WBC: 10.1 10*3/uL (ref 4.0–10.5)
nRBC: 0 % (ref 0.0–0.2)

## 2019-10-22 LAB — ABO/RH: ABO/RH(D): A POS

## 2019-10-22 LAB — TYPE AND SCREEN
ABO/RH(D): A POS
Antibody Screen: NEGATIVE

## 2019-10-22 LAB — SURGICAL PCR SCREEN
MRSA, PCR: NEGATIVE
Staphylococcus aureus: NEGATIVE

## 2019-10-22 LAB — LIPASE, BLOOD: Lipase: 21 U/L (ref 11–51)

## 2019-10-22 SURGERY — LAPAROTOMY, EXPLORATORY
Anesthesia: General | Site: Abdomen

## 2019-10-22 MED ORDER — METOPROLOL TARTRATE 5 MG/5ML IV SOLN: 5.0000 mg | Freq: Four times a day (QID) | INTRAVENOUS | Status: DC | PRN

## 2019-10-22 MED ORDER — LIDOCAINE 2% (20 MG/ML) 5 ML SYRINGE
INTRAMUSCULAR | Status: DC | PRN
  Administered 2019-10-22: 50 mg via INTRAVENOUS

## 2019-10-22 MED ORDER — HYDROMORPHONE HCL 1 MG/ML IJ SOLN: 0.5000 mg | INTRAMUSCULAR | Status: DC | PRN

## 2019-10-22 MED ORDER — FENTANYL CITRATE (PF) 100 MCG/2ML IJ SOLN
25.0000 ug | INTRAMUSCULAR | Status: DC | PRN
  Administered 2019-10-22: 50 ug via INTRAVENOUS
  Administered 2019-10-22 (×3): 25 ug via INTRAVENOUS

## 2019-10-22 MED ORDER — ONDANSETRON HCL 4 MG/2ML IJ SOLN: 4.0000 mg | Freq: Four times a day (QID) | INTRAMUSCULAR | Status: DC | PRN

## 2019-10-22 MED ORDER — INSULIN ASPART 100 UNIT/ML ~~LOC~~ SOLN
0.0000 [IU] | Freq: Four times a day (QID) | SUBCUTANEOUS | Status: DC
  Administered 2019-10-24 – 2019-10-26 (×8): 1 [IU] via SUBCUTANEOUS

## 2019-10-22 MED ORDER — HYDROMORPHONE HCL 1 MG/ML IJ SOLN
1.0000 mg | INTRAMUSCULAR | Status: DC | PRN
  Administered 2019-10-22 – 2019-10-25 (×9): 1 mg via INTRAVENOUS
  Filled 2019-10-22 (×8): qty 1

## 2019-10-22 MED ORDER — ONDANSETRON 4 MG PO TBDP: 4.0000 mg | ORAL_TABLET | Freq: Four times a day (QID) | ORAL | Status: DC | PRN

## 2019-10-22 MED ORDER — VASOPRESSIN 20 UNIT/ML IV SOLN
INTRAVENOUS | Status: AC
  Filled 2019-10-22: qty 1

## 2019-10-22 MED ORDER — POTASSIUM CHLORIDE 10 MEQ/100ML IV SOLN
10.0000 meq | INTRAVENOUS | Status: DC
  Administered 2019-10-22 (×2): 10 meq via INTRAVENOUS
  Filled 2019-10-22 (×3): qty 100

## 2019-10-22 MED ORDER — LABETALOL HCL 5 MG/ML IV SOLN
INTRAVENOUS | Status: AC
  Filled 2019-10-22: qty 4

## 2019-10-22 MED ORDER — FENTANYL CITRATE (PF) 250 MCG/5ML IJ SOLN
INTRAMUSCULAR | Status: DC | PRN
  Administered 2019-10-22 (×3): 50 ug via INTRAVENOUS
  Administered 2019-10-22: 100 ug via INTRAVENOUS
  Administered 2019-10-22 (×3): 50 ug via INTRAVENOUS

## 2019-10-22 MED ORDER — SUCCINYLCHOLINE CHLORIDE 200 MG/10ML IV SOSY
PREFILLED_SYRINGE | INTRAVENOUS | Status: DC | PRN
  Administered 2019-10-22: 100 mg via INTRAVENOUS

## 2019-10-22 MED ORDER — PHENYLEPHRINE HCL-NACL 10-0.9 MG/250ML-% IV SOLN
INTRAVENOUS | Status: DC | PRN
  Administered 2019-10-22: 50 ug/min via INTRAVENOUS

## 2019-10-22 MED ORDER — SUCCINYLCHOLINE CHLORIDE 200 MG/10ML IV SOSY
PREFILLED_SYRINGE | INTRAVENOUS | Status: AC
  Filled 2019-10-22: qty 10

## 2019-10-22 MED ORDER — VASOPRESSIN 20 UNIT/ML IV SOLN
INTRAVENOUS | Status: DC | PRN
  Administered 2019-10-22: 2 [IU] via INTRAVENOUS

## 2019-10-22 MED ORDER — LACTATED RINGERS IV SOLN: INTRAVENOUS | Status: DC | PRN

## 2019-10-22 MED ORDER — SODIUM CHLORIDE 0.9 % IV SOLN
2.0000 g | INTRAVENOUS | Status: AC
  Administered 2019-10-22: 2 g via INTRAVENOUS
  Filled 2019-10-22: qty 2

## 2019-10-22 MED ORDER — IOHEXOL 300 MG/ML  SOLN
100.0000 mL | Freq: Once | INTRAMUSCULAR | Status: AC | PRN
  Administered 2019-10-22: 100 mL via INTRAVENOUS

## 2019-10-22 MED ORDER — HEPARIN SODIUM (PORCINE) 5000 UNIT/ML IJ SOLN
5000.0000 [IU] | Freq: Three times a day (TID) | INTRAMUSCULAR | Status: DC
  Filled 2019-10-22: qty 1

## 2019-10-22 MED ORDER — FENTANYL CITRATE (PF) 100 MCG/2ML IJ SOLN
INTRAMUSCULAR | Status: AC
  Filled 2019-10-22: qty 2

## 2019-10-22 MED ORDER — ACETAMINOPHEN 500 MG PO TABS: 1000.0000 mg | ORAL_TABLET | Freq: Four times a day (QID) | ORAL | Status: DC

## 2019-10-22 MED ORDER — SIMETHICONE 80 MG PO CHEW: 40.0000 mg | CHEWABLE_TABLET | Freq: Four times a day (QID) | ORAL | Status: DC | PRN

## 2019-10-22 MED ORDER — HYDRALAZINE HCL 20 MG/ML IJ SOLN
INTRAMUSCULAR | Status: AC
  Filled 2019-10-22: qty 1

## 2019-10-22 MED ORDER — 0.9 % SODIUM CHLORIDE (POUR BTL) OPTIME
TOPICAL | Status: DC | PRN
  Administered 2019-10-22: 2000 mL

## 2019-10-22 MED ORDER — ONDANSETRON HCL 4 MG/2ML IJ SOLN
INTRAMUSCULAR | Status: DC | PRN
  Administered 2019-10-22: 4 mg via INTRAVENOUS

## 2019-10-22 MED ORDER — LIDOCAINE 2% (20 MG/ML) 5 ML SYRINGE
INTRAMUSCULAR | Status: AC
  Filled 2019-10-22: qty 5

## 2019-10-22 MED ORDER — FENTANYL CITRATE (PF) 250 MCG/5ML IJ SOLN
INTRAMUSCULAR | Status: AC
  Filled 2019-10-22: qty 5

## 2019-10-22 MED ORDER — HYDROMORPHONE HCL 1 MG/ML IJ SOLN
INTRAMUSCULAR | Status: AC
  Filled 2019-10-22: qty 1

## 2019-10-22 MED ORDER — LABETALOL HCL 5 MG/ML IV SOLN
INTRAVENOUS | Status: DC | PRN
  Administered 2019-10-22: 5 mg via INTRAVENOUS
  Administered 2019-10-22 (×2): 2.5 mg via INTRAVENOUS

## 2019-10-22 MED ORDER — HYDRALAZINE HCL 20 MG/ML IJ SOLN
10.0000 mg | Freq: Once | INTRAMUSCULAR | Status: AC
  Administered 2019-10-22: 10 mg via INTRAVENOUS

## 2019-10-22 MED ORDER — POTASSIUM CHLORIDE 10 MEQ/100ML IV SOLN
10.0000 meq | INTRAVENOUS | Status: AC
  Administered 2019-10-22 (×2): 10 meq via INTRAVENOUS
  Filled 2019-10-22: qty 100

## 2019-10-22 MED ORDER — ALBUMIN HUMAN 5 % IV SOLN
12.5000 g | Freq: Once | INTRAVENOUS | Status: AC
  Administered 2019-10-22: 12.5 g via INTRAVENOUS
  Filled 2019-10-22: qty 250

## 2019-10-22 MED ORDER — PHENYLEPHRINE 40 MCG/ML (10ML) SYRINGE FOR IV PUSH (FOR BLOOD PRESSURE SUPPORT)
PREFILLED_SYRINGE | INTRAVENOUS | Status: AC
  Filled 2019-10-22: qty 10

## 2019-10-22 MED ORDER — FENTANYL CITRATE (PF) 100 MCG/2ML IJ SOLN
100.0000 ug | Freq: Once | INTRAMUSCULAR | Status: AC
  Administered 2019-10-22: 100 ug via INTRAVENOUS
  Filled 2019-10-22: qty 2

## 2019-10-22 MED ORDER — METHOCARBAMOL 1000 MG/10ML IJ SOLN
500.0000 mg | Freq: Three times a day (TID) | INTRAVENOUS | Status: DC | PRN
  Administered 2019-10-22 – 2019-10-24 (×4): 500 mg via INTRAVENOUS
  Filled 2019-10-22: qty 5
  Filled 2019-10-22: qty 500
  Filled 2019-10-22 (×5): qty 5

## 2019-10-22 MED ORDER — DIPHENHYDRAMINE HCL 50 MG/ML IJ SOLN: 12.5000 mg | Freq: Four times a day (QID) | INTRAMUSCULAR | Status: DC | PRN

## 2019-10-22 MED ORDER — CHLORHEXIDINE GLUCONATE CLOTH 2 % EX PADS
6.0000 | MEDICATED_PAD | Freq: Every day | CUTANEOUS | Status: DC
  Administered 2019-10-23 – 2019-10-27 (×5): 6 via TOPICAL

## 2019-10-22 MED ORDER — OXYCODONE HCL 5 MG PO TABS: 5.0000 mg | ORAL_TABLET | Freq: Once | ORAL | Status: DC | PRN

## 2019-10-22 MED ORDER — ENOXAPARIN SODIUM 40 MG/0.4ML ~~LOC~~ SOLN
40.0000 mg | SUBCUTANEOUS | Status: DC
  Administered 2019-10-24 – 2019-10-27 (×4): 40 mg via SUBCUTANEOUS
  Filled 2019-10-22 (×6): qty 0.4

## 2019-10-22 MED ORDER — DIPHENHYDRAMINE HCL 12.5 MG/5ML PO ELIX: 12.5000 mg | ORAL_SOLUTION | Freq: Four times a day (QID) | ORAL | Status: DC | PRN

## 2019-10-22 MED ORDER — ALBUMIN HUMAN 5 % IV SOLN: INTRAVENOUS | Status: DC | PRN

## 2019-10-22 MED ORDER — ROCURONIUM BROMIDE 10 MG/ML (PF) SYRINGE
PREFILLED_SYRINGE | INTRAVENOUS | Status: DC | PRN
  Administered 2019-10-22: 20 mg via INTRAVENOUS
  Administered 2019-10-22: 50 mg via INTRAVENOUS

## 2019-10-22 MED ORDER — PROPOFOL 10 MG/ML IV BOLUS
INTRAVENOUS | Status: DC | PRN
  Administered 2019-10-22: 130 mg via INTRAVENOUS

## 2019-10-22 MED ORDER — DEXAMETHASONE SODIUM PHOSPHATE 10 MG/ML IJ SOLN
INTRAMUSCULAR | Status: DC | PRN
  Administered 2019-10-22: 10 mg via INTRAVENOUS

## 2019-10-22 MED ORDER — DEXTROSE-NACL 5-0.9 % IV SOLN: INTRAVENOUS | Status: DC

## 2019-10-22 MED ORDER — ONDANSETRON HCL 4 MG/2ML IJ SOLN
INTRAMUSCULAR | Status: AC
  Filled 2019-10-22: qty 2

## 2019-10-22 MED ORDER — PHENYLEPHRINE 40 MCG/ML (10ML) SYRINGE FOR IV PUSH (FOR BLOOD PRESSURE SUPPORT)
PREFILLED_SYRINGE | INTRAVENOUS | Status: DC | PRN
  Administered 2019-10-22: 120 ug via INTRAVENOUS
  Administered 2019-10-22: 160 ug via INTRAVENOUS
  Administered 2019-10-22: 120 ug via INTRAVENOUS

## 2019-10-22 MED ORDER — DIATRIZOATE MEGLUMINE & SODIUM 66-10 % PO SOLN
90.0000 mL | Freq: Once | ORAL | Status: AC
  Administered 2019-10-22: 90 mL via NASOGASTRIC
  Filled 2019-10-22: qty 90

## 2019-10-22 MED ORDER — ROCURONIUM BROMIDE 10 MG/ML (PF) SYRINGE
PREFILLED_SYRINGE | INTRAVENOUS | Status: AC
  Filled 2019-10-22: qty 10

## 2019-10-22 MED ORDER — OXYCODONE HCL 5 MG/5ML PO SOLN: 5.0000 mg | Freq: Once | ORAL | Status: DC | PRN

## 2019-10-22 MED ORDER — SODIUM CHLORIDE 0.9% FLUSH: 3.0000 mL | Freq: Once | INTRAVENOUS | Status: DC

## 2019-10-22 MED ORDER — LACTATED RINGERS IV SOLN: INTRAVENOUS | Status: DC

## 2019-10-22 MED ORDER — DEXAMETHASONE SODIUM PHOSPHATE 10 MG/ML IJ SOLN
INTRAMUSCULAR | Status: AC
  Filled 2019-10-22: qty 1

## 2019-10-22 MED ORDER — KCL IN DEXTROSE-NACL 20-5-0.45 MEQ/L-%-% IV SOLN
INTRAVENOUS | Status: DC
  Filled 2019-10-22: qty 1000

## 2019-10-22 MED ORDER — SUGAMMADEX SODIUM 200 MG/2ML IV SOLN
INTRAVENOUS | Status: DC | PRN
  Administered 2019-10-22: 200 mg via INTRAVENOUS

## 2019-10-22 MED ORDER — PROPOFOL 10 MG/ML IV BOLUS
INTRAVENOUS | Status: AC
  Filled 2019-10-22: qty 20

## 2019-10-22 SURGICAL SUPPLY — 40 items
BLADE CLIPPER SURG (BLADE) IMPLANT
CANISTER SUCT 3000ML PPV (MISCELLANEOUS) ×3 IMPLANT
CHLORAPREP W/TINT 26 (MISCELLANEOUS) ×3 IMPLANT
COVER SURGICAL LIGHT HANDLE (MISCELLANEOUS) ×3 IMPLANT
DRAPE LAPAROSCOPIC ABDOMINAL (DRAPES) ×3 IMPLANT
DRAPE WARM FLUID 44X44 (DRAPES) ×3 IMPLANT
DRSG OPSITE POSTOP 4X10 (GAUZE/BANDAGES/DRESSINGS) IMPLANT
DRSG OPSITE POSTOP 4X12 (GAUZE/BANDAGES/DRESSINGS) ×3 IMPLANT
DRSG OPSITE POSTOP 4X8 (GAUZE/BANDAGES/DRESSINGS) IMPLANT
ELECT BLADE 6.5 EXT (BLADE) IMPLANT
ELECT CAUTERY BLADE 6.4 (BLADE) ×3 IMPLANT
ELECT REM PT RETURN 9FT ADLT (ELECTROSURGICAL) ×3
ELECTRODE REM PT RTRN 9FT ADLT (ELECTROSURGICAL) ×2 IMPLANT
GLOVE BIO SURGEON STRL SZ7.5 (GLOVE) ×3 IMPLANT
GLOVE BIOGEL PI IND STRL 8 (GLOVE) ×2 IMPLANT
GLOVE BIOGEL PI INDICATOR 8 (GLOVE) ×1
GOWN STRL REUS W/ TWL LRG LVL3 (GOWN DISPOSABLE) ×2 IMPLANT
GOWN STRL REUS W/ TWL XL LVL3 (GOWN DISPOSABLE) ×2 IMPLANT
GOWN STRL REUS W/TWL LRG LVL3 (GOWN DISPOSABLE) ×1
GOWN STRL REUS W/TWL XL LVL3 (GOWN DISPOSABLE) ×1
HANDLE SUCTION POOLE (INSTRUMENTS) ×2 IMPLANT
KIT BASIN OR (CUSTOM PROCEDURE TRAY) ×3 IMPLANT
KIT TURNOVER KIT B (KITS) ×3 IMPLANT
LIGASURE IMPACT 36 18CM CVD LR (INSTRUMENTS) IMPLANT
NS IRRIG 1000ML POUR BTL (IV SOLUTION) ×6 IMPLANT
PACK GENERAL/GYN (CUSTOM PROCEDURE TRAY) ×3 IMPLANT
PAD ARMBOARD 7.5X6 YLW CONV (MISCELLANEOUS) ×3 IMPLANT
PENCIL SMOKE EVACUATOR (MISCELLANEOUS) ×3 IMPLANT
SPECIMEN JAR LARGE (MISCELLANEOUS) IMPLANT
SPONGE LAP 18X18 RF (DISPOSABLE) IMPLANT
STAPLER VISISTAT 35W (STAPLE) ×3 IMPLANT
SUCTION POOLE HANDLE (INSTRUMENTS) ×3
SUT PDS AB 1 TP1 54 (SUTURE) ×6 IMPLANT
SUT SILK 2 0 SH CR/8 (SUTURE) ×3 IMPLANT
SUT SILK 2 0 TIES 10X30 (SUTURE) ×3 IMPLANT
SUT SILK 3 0 SH CR/8 (SUTURE) ×3 IMPLANT
SUT SILK 3 0 TIES 10X30 (SUTURE) ×3 IMPLANT
TOWEL GREEN STERILE (TOWEL DISPOSABLE) ×3 IMPLANT
TRAY FOLEY MTR SLVR 16FR STAT (SET/KITS/TRAYS/PACK) ×3 IMPLANT
YANKAUER SUCT BULB TIP NO VENT (SUCTIONS) IMPLANT

## 2019-10-22 NOTE — Progress Notes (Signed)
Initial Nutrition Assessment  DOCUMENTATION CODES:   Not applicable  INTERVENTION:   -RD will follow for diet advancement and supplement as appropriate  NUTRITION DIAGNOSIS:   Increased nutrient needs related to post-op healing as evidenced by estimated needs.  GOAL:   Patient will meet greater than or equal to 90% of their needs  MONITOR:   Diet advancement, Labs, Weight trends, Skin, I & O's  REASON FOR ASSESSMENT:   Malnutrition Screening Tool    ASSESSMENT:   Stuart Watts is an 69 y.o. male with hx of recurrent SBOs whom was just discharged from the hospital 6 days ago after being admitted for 1 day with small bowel obstruction.  Reportedly, after NG tube placement, he removed at his last admission and began having bowel function.  He was having multiple loose bowel movements.  He was tolerating clears and his diet advanced.  He left [facility] to be treated at [facility] where he was for a day and then discharged.  He reports that on discharge, he was not feeling back to his regular self.  He reports since being home, progressive distention, nausea/vomiting.  He denies any significant abdominal pain.  He does have discomfort related to the distention however.  He reports that he had a small bowel movement and some gas earlier in the day.  Pt admitted with SBO.   2/19- NGT placed; s/p Procedure(s): EXPLORATORY LAPAROTOMY (N/A) Adhesiolysis x 90 minutes Small bowel enterotomy repair x1  Reviewed I/O's: -2.1 L x 24 hours  NGT output: 2.4 L x 24 hours  Pt down in OR for surgery at time of visit.  Pt familiar to this RD due to previous assessment during admission last week. During this visit, pt reports ongoing weight loss since prior surgery in 2020. He complained that when he eats "everything goes in one end and out the other". His UBW is around 175#, which he last weighed about 6 months ago. He has been very concerned about his weight loss and is interested in  cosuming nutritional supplements when diet is advanced to help regain weight.   Highly suspect pt with malnutrition, which was was identified during last admission (severe malnutrition in the setting of chronic illness).   Medications reviewed and include dextrose 5% and 0.45% NaCl with KCl 20 mEq/L infusion @ 100 ml/hr and lactated ringers infusion @ 10 ml/hr.   Labs reviewed: K: 3.0.   Diet Order:   Diet Order    None      EDUCATION NEEDS:   No education needs have been identified at this time  Skin:  Skin Assessment: Skin Integrity Issues: Skin Integrity Issues:: Incisions Incisions: closed abdomen  Last BM:  10/21/19  Height:   Ht Readings from Last 1 Encounters:  10/22/19 5\' 11"  (1.803 m)    Weight:   Wt Readings from Last 1 Encounters:  10/22/19 61.6 kg    Ideal Body Weight:  78.2 kg  BMI:  Body mass index is 18.94 kg/m.  Estimated Nutritional Needs:   Kcal:  2200-2400  Protein:  120-135 grams  Fluid:  > 2.2 L    10/24/19, RD, LDN, CDCES Registered Dietitian II Certified Diabetes Care and Education Specialist Please refer to [facility] for RD and/or RD on-call/weekend/after hours pager

## 2019-10-22 NOTE — H&P (Signed)
CC: Distention, n/v  Requesting provider: Dr. Nicanor Alcon - SBO  HPI: Stuart Watts is an 69 y.o. male with hx of recurrent SBOs whom was just discharged from the hospital 6 days ago after being admitted for 1 day with small bowel obstruction.  Reportedly, after NG tube placement, he removed at his last admission and began having bowel function.  He was having multiple loose bowel movements.  He was tolerating clears and his diet advanced.  He left [facility] to be treated at [facility] where he was for a day and then discharged.  He reports that on discharge, he was not feeling back to his regular self.  He reports since being home, progressive distention, nausea/vomiting.  He denies any significant abdominal pain.  He does have discomfort related to the distention however.  He reports that he had a small bowel movement and some gas earlier in the day.  He saw Dr. Karilyn Cota 2/16 whom noted steady improvement but since, things have recurred.  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  Past Medical History:  Diagnosis Date  . SBO (small bowel obstruction) (HCC) 10/2019    Past Surgical History:  Procedure Laterality Date  . ABDOMINAL SURGERY  2020   LOA  . LYSIS OF ADHESION  2007    Family History  Problem Relation Age of Onset  . Diabetes Father     Social:  reports that he has been smoking cigarettes and cigars. He has been smoking about 0.50 packs per day. He has never used smokeless tobacco. He reports that he does not drink alcohol or use drugs.  Allergies: No Known Allergies  Medications: I have reviewed the patient's current medications.  Results for orders placed or performed during the hospital encounter of 10/22/19 (from the past 48 hour(s))  Lipase, blood     Status: None   Collection Time: 10/22/19 12:48 AM    Result Value Ref Range   Lipase 21 11 - 51 U/L    Comment: Performed at [facility], [address] [place], [place] [address]  Comprehensive metabolic panel     Status: Abnormal   Collection Time: 10/22/19 12:48 AM  Result Value Ref Range   Sodium 143 135 - 145 mmol/L   Potassium 3.0 (L) 3.5 - 5.1 mmol/L   Chloride 100 98 - 111 mmol/L   CO2 28 22 - 32 mmol/L   Glucose, Bld 161 (H) 70 - 99 mg/dL   BUN 15 8 - 23 mg/dL   Creatinine, Ser 7.56 (H) 0.61 - 1.24 mg/dL   Calcium 9.6 8.9 - 43.3 mg/dL   Total Protein 7.6 6.5 - 8.1 g/dL   Albumin 4.3 3.5 - 5.0 g/dL   AST 15 15 - 41 U/L   ALT 18 0 - 44 U/L   Alkaline Phosphatase 76 38 - 126 U/L   Total Bilirubin 1.0 0.3 - 1.2 mg/dL   GFR calc non Af Amer 50 (L) >60 mL/min   GFR calc Af Amer 58 (L) >60 mL/min   Anion gap 15 5 - 15    Comment: Performed at [facility], [address] [place], [place] [address]  CBC     Status: None   Collection Time: 10/22/19 12:48 AM  Result Value Ref Range   WBC 10.1 4.0 - 10.5 K/uL  RBC 4.67 4.22 - 5.81 MIL/uL   Hemoglobin 15.3 13.0 - 17.0 g/dL   HCT 45.1 39.0 - 52.0 %   MCV 96.6 80.0 - 100.0 fL   MCH 32.8 26.0 - 34.0 pg   MCHC 33.9 30.0 - 36.0 g/dL   RDW 13.4 11.5 - 15.5 %   Platelets 224 150 - 400 K/uL   nRBC 0.0 0.0 - 0.2 %    Comment: Performed at [facility], [place] [address] [place], [place] [address]  Respiratory Panel by RT PCR (Flu A&B, Covid) - Nasopharyngeal Swab     Status: None   Collection Time: 10/22/19  1:07 AM   Specimen: Nasopharyngeal Swab  Result Value Ref Range   SARS Coronavirus 2 by RT PCR NEGATIVE NEGATIVE    Comment: (NOTE) SARS-CoV-2 target nucleic acids are NOT DETECTED. The SARS-CoV-2 RNA is generally detectable in upper respiratoy specimens during the acute phase of infection. The lowest concentration of SARS-CoV-2 viral copies this assay can detect is 131 copies/mL. A negative result does not preclude SARS-Cov-2 infection and should not be  used as the sole basis for treatment or other patient management decisions. A negative result may occur with  improper specimen collection/handling, submission of specimen other than nasopharyngeal swab, presence of viral mutation(s) within the areas targeted by this assay, and inadequate number of viral copies (<131 copies/mL). A negative result must be combined with clinical observations, patient history, and epidemiological information. The expected result is Negative. Fact Sheet for Patients:  PinkCheek.be Fact Sheet for Healthcare Providers:  GravelBags.it This test is not yet ap proved or cleared by the [place] FDA and  has been authorized for detection and/or diagnosis of SARS-CoV-2 by FDA under an Emergency Use Authorization (EUA). This EUA will remain  in effect (meaning this test can be used) for the duration of the COVID-19 declaration under Section 564(b)(1) of the Act, 21 U.S.C. section 360bbb-3(b)(1), unless the authorization is terminated or revoked sooner.    Influenza A by PCR NEGATIVE NEGATIVE   Influenza B by PCR NEGATIVE NEGATIVE    Comment: (NOTE) The Xpert Xpress SARS-CoV-2/FLU/RSV assay is intended as an aid in  the diagnosis of influenza from Nasopharyngeal swab specimens and  should not be used as a sole basis for treatment. Nasal washings and  aspirates are unacceptable for Xpert Xpress SARS-CoV-2/FLU/RSV  testing. Fact Sheet for Patients: PinkCheek.be Fact Sheet for Healthcare Providers: GravelBags.it This test is not yet approved or cleared by the [place] FDA and  has been authorized for detection and/or diagnosis of SARS-CoV-2 by  FDA under an Emergency Use Authorization (EUA). This EUA will remain  in effect (meaning this test can be used) for the duration of the  Covid-19 declaration under Section 564(b)(1) of the Act, 21    U.S.C. section 360bbb-3(b)(1), unless the authorization is  terminated or revoked. Performed at [facility], [place] [address], [place],  [address]     CT ABDOMEN PELVIS W CONTRAST  Result Date: 10/22/2019 CLINICAL DATA:  Abdominal pain with nausea and vomiting EXAM: CT ABDOMEN AND PELVIS WITH CONTRAST TECHNIQUE: Multidetector CT imaging of the abdomen and pelvis was performed using the standard protocol following bolus administration of intravenous contrast. CONTRAST:  135mL OMNIPAQUE IOHEXOL 300 MG/ML  SOLN COMPARISON:  10/15/2019 FINDINGS: LOWER CHEST: Normal. HEPATOBILIARY: Small amount of fluid along the anterior hepatic margin new since the prior study. Normal hepatic contours. No intra- or extrahepatic biliary dilatation. Normal gallbladder. PANCREAS: Normal pancreas.  No ductal dilatation or peripancreatic fluid collection. SPLEEN: Normal. ADRENALS/URINARY TRACT: The adrenal glands are normal. No hydronephrosis, nephroureterolithiasis or solid renal mass. 3 cm left renal cyst. The urinary bladder is normal for degree of distention STOMACH/BOWEL: There is a sliding-type hiatal hernia within air-fluid level. The stomach is markedly distended. Small bowel remains diffusely dilated and fluid-filled. Right lower quadrant small bowel remains massively dilated measuring up to 10.7 cm in diameter. Much of the colon is decompressed. The appendix is not visible. Redemonstration of left mid abdominal surgical anastomosis. VASCULAR/LYMPHATIC: There is calcific atherosclerosis of the abdominal aorta. No abdominal or pelvic lymphadenopathy. REPRODUCTIVE: Normal prostate size with symmetric seminal vesicles. MUSCULOSKELETAL. No bony spinal canal stenosis or focal osseous abnormality. OTHER: None. IMPRESSION: 1. Diffusely fluid-filled, dilated small bowel, consistent with small bowel obstruction, little changed since 10/15/2019. 2. Small amount of free fluid along the anterior hepatic margin is new  since the prior study. 3. Aortic Atherosclerosis (ICD10-I70.0). Electronically Signed   By: Deatra Robinson M.D.   On: 10/22/2019 02:18    ROS - all of the below systems have been reviewed with the patient and positives are indicated with bold text General: chills, fever or night sweats Eyes: blurry vision or double vision ENT: epistaxis or sore throat Allergy/Immunology: itchy/watery eyes or nasal congestion Hematologic/Lymphatic: bleeding problems, blood clots or swollen lymph nodes Endocrine: temperature intolerance or unexpected weight changes Breast: new or changing breast lumps or nipple discharge Resp: cough, shortness of breath, or wheezing CV: chest pain or dyspnea on exertion GI: as per HPI GU: dysuria, trouble voiding, or hematuria MSK: joint pain or joint stiffness Neuro: TIA or stroke symptoms Derm: pruritus and skin lesion changes Psych: anxiety and depression  PE Blood pressure (!) 141/103, pulse (!) 117, temperature 98.1 F (36.7 C), resp. rate 20, SpO2 100 %. Constitutional: NAD; conversant; no deformities Eyes: Moist conjunctiva; no lid lag; anicteric; PERRL Neck: Trachea midline; no thyromegaly Lungs: Normal respiratory effort; no tactile fremitus CV: RRR; no palpable thrills; no pitting edema GI: Abd soft, moderately distended; nontender; no rebound/guarding; no palpable hepatosplenomegaly MSK: Unable to assess gait; no clubbing/cyanosis Psychiatric: Appropriate affect; alert and oriented x3 Lymphatic: No palpable cervical or axillary lymphadenopathy  Results for orders placed or performed during the hospital encounter of 10/22/19 (from the past 48 hour(s))  Lipase, blood     Status: None   Collection Time: 10/22/19 12:48 AM  Result Value Ref Range   Lipase 21 11 - 51 U/L    Comment: Performed at [facility], [address] [place], [place] [address]  Comprehensive metabolic panel     Status: Abnormal   Collection Time: 10/22/19 12:48 AM  Result  Value Ref Range   Sodium 143 135 - 145 mmol/L   Potassium 3.0 (L) 3.5 - 5.1 mmol/L   Chloride 100 98 - 111 mmol/L   CO2 28 22 - 32 mmol/L   Glucose, Bld 161 (H) 70 - 99 mg/dL   BUN 15 8 - 23 mg/dL   Creatinine, Ser 8.50 (H) 0.61 - 1.24 mg/dL   Calcium 9.6 8.9 - 27.7 mg/dL   Total Protein 7.6 6.5 - 8.1 g/dL   Albumin 4.3 3.5 - 5.0 g/dL   AST 15 15 - 41 U/L   ALT 18 0 - 44 U/L   Alkaline Phosphatase 76 38 - 126 U/L   Total Bilirubin 1.0 0.3 - 1.2 mg/dL   GFR calc non Af Amer 50 (L) >60 mL/min   GFR  calc Af Amer 58 (L) >60 mL/min   Anion gap 15 5 - 15    Comment: Performed at [facility], [address], [place], [place] [address]  CBC     Status: None   Collection Time: 10/22/19 12:48 AM  Result Value Ref Range   WBC 10.1 4.0 - 10.5 K/uL   RBC 4.67 4.22 - 5.81 MIL/uL   Hemoglobin 15.3 13.0 - 17.0 g/dL   HCT 00.7 62.2 - 63.3 %   MCV 96.6 80.0 - 100.0 fL   MCH 32.8 26.0 - 34.0 pg   MCHC 33.9 30.0 - 36.0 g/dL   RDW 35.4 56.2 - 56.3 %   Platelets 224 150 - 400 K/uL   nRBC 0.0 0.0 - 0.2 %    Comment: Performed at [facility], [address], [place], [place] [address]  Respiratory Panel by RT PCR (Flu A&B, Covid) - Nasopharyngeal Swab     Status: None   Collection Time: 10/22/19  1:07 AM   Specimen: Nasopharyngeal Swab  Result Value Ref Range   SARS Coronavirus 2 by RT PCR NEGATIVE NEGATIVE    Comment: (NOTE) SARS-CoV-2 target nucleic acids are NOT DETECTED. The SARS-CoV-2 RNA is generally detectable in upper respiratoy specimens during the acute phase of infection. The lowest concentration of SARS-CoV-2 viral copies this assay can detect is 131 copies/mL. A negative result does not preclude SARS-Cov-2 infection and should not be used as the sole basis for treatment or other patient management decisions. A negative result may occur with  improper specimen collection/handling, submission of specimen other than nasopharyngeal swab, presence of viral mutation(s)  within the areas targeted by this assay, and inadequate number of viral copies (<131 copies/mL). A negative result must be combined with clinical observations, patient history, and epidemiological information. The expected result is Negative. Fact Sheet for Patients:  https://www.moore.com/ Fact Sheet for Healthcare Providers:  https://www.young.biz/ This test is not yet ap proved or cleared by the [place] FDA and  has been authorized for detection and/or diagnosis of SARS-CoV-2 by FDA under an Emergency Use Authorization (EUA). This EUA will remain  in effect (meaning this test can be used) for the duration of the COVID-19 declaration under Section 564(b)(1) of the Act, 21 U.S.C. section 360bbb-3(b)(1), unless the authorization is terminated or revoked sooner.    Influenza A by PCR NEGATIVE NEGATIVE   Influenza B by PCR NEGATIVE NEGATIVE    Comment: (NOTE) The Xpert Xpress SARS-CoV-2/FLU/RSV assay is intended as an aid in  the diagnosis of influenza from Nasopharyngeal swab specimens and  should not be used as a sole basis for treatment. Nasal washings and  aspirates are unacceptable for Xpert Xpress SARS-CoV-2/FLU/RSV  testing. Fact Sheet for Patients: https://www.moore.com/ Fact Sheet for Healthcare Providers: https://www.young.biz/ This test is not yet approved or cleared by the [place] FDA and  has been authorized for detection and/or diagnosis of SARS-CoV-2 by  FDA under an Emergency Use Authorization (EUA). This EUA will remain  in effect (meaning this test can be used) for the duration of the  Covid-19 declaration under Section 564(b)(1) of the Act, 21  U.S.C. section 360bbb-3(b)(1), unless the authorization is  terminated or revoked. Performed at [facility], [address], [place], [place] [address]     CT ABDOMEN PELVIS W CONTRAST  Result Date: 10/22/2019 CLINICAL  DATA:  Abdominal pain with nausea and vomiting EXAM: CT ABDOMEN AND PELVIS WITH CONTRAST TECHNIQUE: Multidetector CT imaging of  the abdomen and pelvis was performed using the standard protocol following bolus administration of intravenous contrast. CONTRAST:  OMNIPAQUE IOHEXOL 300 MG/ML  SOLN COMPARISON:  10/15/2019 FINDINGS: LOWER CHEST: Normal. HEPATOBILIARY: Small amount of fluid along the anterior hepatic margin new since the prior study. Normal hepatic contours. No intra- or extrahepatic biliary dilatation. Normal gallbladder. PANCREAS: Normal pancreas. No ductal dilatation or peripancreatic fluid collection. SPLEEN: Normal. ADRENALS/URINARY TRACT: The adrenal glands are normal. No hydronephrosis, nephroureterolithiasis or solid renal mass. 3 cm left renal cyst. The urinary bladder is normal for degree of distention STOMACH/BOWEL: There is a sliding-type hiatal hernia within air-fluid level. The stomach is markedly distended. Small bowel remains diffusely dilated and fluid-filled. Right lower quadrant small bowel remains massively dilated measuring up to 10.7 cm in diameter. Much of the colon is decompressed. The appendix is not visible. Redemonstration of left mid abdominal surgical anastomosis. VASCULAR/LYMPHATIC: There is calcific atherosclerosis of the abdominal aorta. No abdominal or pelvic lymphadenopathy. REPRODUCTIVE: Normal prostate size with symmetric seminal vesicles. MUSCULOSKELETAL. No bony spinal canal stenosis or focal osseous abnormality. OTHER: None. IMPRESSION: 1. Diffusely fluid-filled, dilated small bowel, consistent with small bowel obstruction, little changed since 10/15/2019. 2. Small amount of free fluid along the anterior hepatic margin is new since the prior study. 3. Aortic Atherosclerosis (ICD10-I70.0). Electronically Signed   By: Deatra Robinson M.D.   On: 10/22/2019 02:18   A/P: Stuart Watts is an 69 y.o. male with SBO  -Admit to ward -NPO, NG tube, small bowel  obstruction protocol -He reports that his previous admission he was told by a surgeon that he would potentially require surgery but was adamant that he did not want this.  He states that he has come to the realization at this point that he may end up needing surgery and is amenable to it.  He states he understands what surgery entails as he has had the surgery at least 2 times before for recurrent obstructions.  Stephanie Coup. Cliffton Asters, M.D. [facility] Use [email or web address] to contact on call provider

## 2019-10-22 NOTE — Progress Notes (Signed)
Spoke with Primary RN, PICC will be done tomorrow. Patient just got back from OR and still sedated.

## 2019-10-22 NOTE — Telephone Encounter (Signed)
FYI: Patient has been admitted to [facility] and appears to be scheduled for surgery

## 2019-10-22 NOTE — ED Provider Notes (Signed)
[facility] EMERGENCY DEPARTMENT Provider Note   CSN: [id number] Arrival date & time: 10/22/19  0024     History Chief Complaint  Patient presents with  . Abdominal Pain    Shneur [place] Burgeson is a 69 y.o. male.  The history is provided by the patient.  Abdominal Pain Pain location:  Generalized Pain quality: aching   Pain radiates to:  Does not radiate Pain severity:  Severe Onset quality:  Gradual Duration:  1 day Timing:  Constant Progression:  Unchanged Chronicity:  Recurrent (left hospital last week post admit for SBO but at the time did not want the surgery.  ) Context: not alcohol use, not eating and not trauma   Relieved by:  Nothing Worsened by:  Nothing Ineffective treatments:  None tried Associated symptoms: nausea and vomiting   Associated symptoms: no chest pain, no chills, no dysuria, no fever and no shortness of breath   Risk factors: no alcohol abuse   Patient with multiple previous SBOs who was admitted to the surgery service last week but at the time decided not to have surgery presents with worsening pain and recurrence of emesis.       Past Medical History:  Diagnosis Date  . SBO (small bowel obstruction) (HCC) 10/2019    Patient Active Problem List   Diagnosis Date Noted  . 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 Protein-calorie malnutrition, moderate (HCC) 10/16/2019  . Noncompliance with NG tube 10/16/2019  . History of small bowel obstruction 10/16/2019  . SBO (small bowel obstruction) (HCC) 10/15/2019    Past Surgical History:  Procedure Laterality Date  . ABDOMINAL SURGERY  2020   LOA  . LYSIS OF ADHESION  2007       Family History  Problem Relation Age of Onset  . Diabetes Father     Social History   Tobacco Use  . Smoking status: Current Every Day Smoker    Packs/day: 0.50    Types: Cigarettes, Cigars  . Smokeless tobacco: Never Used  Substance  Use Topics  . Alcohol use: Never  . Drug use: Never    Home Medications Prior to Admission medications   Medication Sig Start Date End Date Taking? Authorizing Provider  metroNIDAZOLE (FLAGYL) 250 MG tablet Take 1 tablet (250 mg total) by mouth 3 (three) times daily. 10/19/19   Malissa Hippo, MD  Probiotic Product (ALIGN) 4 MG CAPS Take 4 mg by mouth daily.    [provider]  Simethicone (PHAZYME ULTRA STRENGTH) 180 MG CAPS Take 1 capsule (180 mg total) by mouth 3 (three) times daily as needed. 10/19/19   Rehman, Joline Maxcy, MD  traMADol (ULTRAM) 50 MG tablet Take 1 tablet (50 mg total) by mouth 3 (three) times daily as needed. 10/19/19   Malissa Hippo, MD    Allergies    Patient has no known allergies.  Review of Systems   Review of Systems  Constitutional: Negative for chills and fever.  HENT: Negative for congestion.   Eyes: Negative for visual disturbance.  Respiratory: Negative for shortness of breath.   Cardiovascular: Negative for chest pain.  Gastrointestinal: Positive for abdominal distention, abdominal pain, nausea and vomiting.  Genitourinary: Negative for dysuria.  Musculoskeletal: Negative for arthralgias.  Neurological: Negative for dizziness.  Psychiatric/Behavioral: Negative for agitation.  All other systems reviewed and are negative.   Physical Exam Updated Vital Signs BP (!) 141/103   Pulse (!) 117   Temp  98.1 F (36.7 C)   Resp 20   SpO2 100%   Physical Exam Vitals and nursing note reviewed.  Constitutional:      General: He is not in acute distress.    Appearance: He is well-developed.  HENT:     Head: Normocephalic.     Nose: Nose normal.  Eyes:     Conjunctiva/sclera: Conjunctivae normal.     Pupils: Pupils are equal, round, and reactive to light.  Cardiovascular:     Rate and Rhythm: Normal rate and regular rhythm.     Pulses: Normal pulses.     Heart sounds: Normal heart sounds.  Pulmonary:     Effort: Pulmonary effort is  normal.     Breath sounds: Normal breath sounds.  Abdominal:     General: There is distension.     Tenderness: There is abdominal tenderness. There is no rebound.  Musculoskeletal:        General: Normal range of motion.     Cervical back: Normal range of motion and neck supple.  Skin:    General: Skin is warm and dry.     Capillary Refill: Capillary refill takes less than 2 seconds.  Neurological:     General: No focal deficit present.     Mental Status: He is alert and oriented to person, place, and time.     Deep Tendon Reflexes: Reflexes normal.  Psychiatric:        Mood and Affect: Mood normal.        Behavior: Behavior normal.     ED Results / Procedures / Treatments   Labs (all labs ordered are listed, but only abnormal results are displayed) Results for orders placed or performed during the hospital encounter of 10/22/19  Respiratory Panel by RT PCR (Flu A&B, Covid) - Nasopharyngeal Swab   Specimen: Nasopharyngeal Swab  Result Value Ref Range   SARS Coronavirus 2 by RT PCR NEGATIVE NEGATIVE   Influenza A by PCR NEGATIVE NEGATIVE   Influenza B by PCR NEGATIVE NEGATIVE  Lipase, blood  Result Value Ref Range   Lipase 21 11 - 51 U/L  Comprehensive metabolic panel  Result Value Ref Range   Sodium 143 135 - 145 mmol/L   Potassium 3.0 (L) 3.5 - 5.1 mmol/L   Chloride 100 98 - 111 mmol/L   CO2 28 22 - 32 mmol/L   Glucose, Bld 161 (H) 70 - 99 mg/dL   BUN 15 8 - 23 mg/dL   Creatinine, Ser 1.28 (H) 0.61 - 1.24 mg/dL   Calcium 9.6 8.9 - 78.6 mg/dL   Total Protein 7.6 6.5 - 8.1 g/dL   Albumin 4.3 3.5 - 5.0 g/dL   AST 15 15 - 41 U/L   ALT 18 0 - 44 U/L   Alkaline Phosphatase 76 38 - 126 U/L   Total Bilirubin 1.0 0.3 - 1.2 mg/dL   GFR calc non Af Amer 50 (L) >60 mL/min   GFR calc Af Amer 58 (L) >60 mL/min   Anion gap 15 5 - 15  CBC  Result Value Ref Range   WBC 10.1 4.0 - 10.5 K/uL   RBC 4.67 4.22 - 5.81 MIL/uL   Hemoglobin 15.3 13.0 - 17.0 g/dL   HCT 76.7 20.9 -  47.0 %   MCV 96.6 80.0 - 100.0 fL   MCH 32.8 26.0 - 34.0 pg   MCHC 33.9 30.0 - 36.0 g/dL   RDW 96.2 83.6 - 62.9 %   Platelets 224 150 -  400 K/uL   nRBC 0.0 0.0 - 0.2 %   DG Abd 1 View  Result Date: 10/15/2019 CLINICAL DATA:  Nasogastric tube placement EXAM: ABDOMEN - 1 VIEW COMPARISON:  Abdominal CT from earlier today FINDINGS: Nasogastric tube with tip near the GE junction and side-port 7 cm above. Stomach is still gas distended. Lungs are clear and the heart size is unremarkable. IMPRESSION: Nasogastric tube with tip at the upper stomach or GE junction and side-port over the lower esophagus. 7 to 8 cm of advancement would provide more optimal positioning. Electronically Signed   By: Monte Fantasia M.D.   On: 10/15/2019 04:44   CT ABDOMEN PELVIS W CONTRAST  Result Date: 10/22/2019 CLINICAL DATA:  Abdominal pain with nausea and vomiting EXAM: CT ABDOMEN AND PELVIS WITH CONTRAST TECHNIQUE: Multidetector CT imaging of the abdomen and pelvis was performed using the standard protocol following bolus administration of intravenous contrast. CONTRAST:  160mL OMNIPAQUE IOHEXOL 300 MG/ML  SOLN COMPARISON:  10/15/2019 FINDINGS: LOWER CHEST: Normal. HEPATOBILIARY: Small amount of fluid along the anterior hepatic margin new since the prior study. Normal hepatic contours. No intra- or extrahepatic biliary dilatation. Normal gallbladder. PANCREAS: Normal pancreas. No ductal dilatation or peripancreatic fluid collection. SPLEEN: Normal. ADRENALS/URINARY TRACT: The adrenal glands are normal. No hydronephrosis, nephroureterolithiasis or solid renal mass. 3 cm left renal cyst. The urinary bladder is normal for degree of distention STOMACH/BOWEL: There is a sliding-type hiatal hernia within air-fluid level. The stomach is markedly distended. Small bowel remains diffusely dilated and fluid-filled. Right lower quadrant small bowel remains massively dilated measuring up to 10.7 cm in diameter. Much of the colon is  decompressed. The appendix is not visible. Redemonstration of left mid abdominal surgical anastomosis. VASCULAR/LYMPHATIC: There is calcific atherosclerosis of the abdominal aorta. No abdominal or pelvic lymphadenopathy. REPRODUCTIVE: Normal prostate size with symmetric seminal vesicles. MUSCULOSKELETAL. No bony spinal canal stenosis or focal osseous abnormality. OTHER: None. IMPRESSION: 1. Diffusely fluid-filled, dilated small bowel, consistent with small bowel obstruction, little changed since 10/15/2019. 2. Small amount of free fluid along the anterior hepatic margin is new since the prior study. 3. Aortic Atherosclerosis (ICD10-I70.0). Electronically Signed   By: Ulyses Jarred M.D.   On: 10/22/2019 02:18   CT ABDOMEN PELVIS W CONTRAST  Result Date: 10/15/2019 CLINICAL DATA:  High-grade partial small bowel obstruction EXAM: CT ABDOMEN AND PELVIS WITH CONTRAST TECHNIQUE: Multidetector CT imaging of the abdomen and pelvis was performed using the standard protocol following bolus administration of intravenous contrast. CONTRAST:  182mL OMNIPAQUE IOHEXOL 300 MG/ML  SOLN COMPARISON:  October 14, 2019 FINDINGS: Lower chest: The visualized heart size within normal limits. No pericardial fluid/thickening. Again noted is a dilated distal esophagus contains fluid. Small bullous change seen at the left lung base. Hepatobiliary: Scattered small hypodense lesions are seen throughout the liver parenchyma the largest measuring 6 mm in the anterior left liver lobe.The main portal vein is patent. Contrast excretion seen within the gallbladder. Pancreas: Unremarkable. No pancreatic ductal dilatation or surrounding inflammatory changes. Spleen: Normal in size without focal abnormality. Adrenals/Urinary Tract: Both adrenal glands appear normal. Scattered low-density lesions are seen throughout both kidneys the largest measuring 2.5 cm within the midpole of the left kidney. No hydronephrosis. The bladder is contrast filled and  unremarkable. Stomach/Bowel: The stomach appears to be partially decompressed in comparison to the prior. Again noted is markedly dilated air, fluid, and debris filled small bowel seen throughout measuring up to 9.3 cm, previously up to 10 cm. There is been a  prior surgical anastomosis seen within the left mid abdomen. The dilated loops are down to the level of the distal ileal loops which appear to be partially decompressed. No focal transition point however is noted. There is air and stool seen within the colon. There is mild question swirling of the mid mesentery at the mesenteric root, series 3, image 38. This was seen on prior and not changed in appearance. Vascular/Lymphatic: There are no enlarged mesenteric, retroperitoneal, or pelvic lymph nodes. Scattered aortic atherosclerotic calcifications are seen without aneurysmal dilatation. Reproductive: The prostate is unremarkable. Other: No evidence of abdominal wall mass or hernia. Trace free fluid seen within the deep pelvis. Musculoskeletal: No acute or significant osseous findings. IMPRESSION: 1. Again findings of a high-grade partial small bowel obstruction, slightly improved in appearance from the prior exam without a focal transition point. There does appear to be some mild swirling of the mid mesentery and this could be due to adhesions. 2. Trace free fluid in the deep pelvis. 3. Mildly dilated fluid filled distal esophagus. 4. Aortic Atherosclerosis (ICD10-I70.0). Electronically Signed   By: Jonna Clark M.D.   On: 10/15/2019 02:47   DG Abd Portable 1V  Result Date: 10/15/2019 CLINICAL DATA:  Enteric catheter placement EXAM: PORTABLE ABDOMEN - 1 VIEW COMPARISON:  10/15/2019 FINDINGS: Frontal view of the chest and upper abdomen are obtained. I do not see an enteric catheter overlying the esophagus or stomach. Lungs are clear. Diffuse gaseous distension of the bowel again noted. IMPRESSION: 1. Enteric catheter is not identified on this exam. 2.  Continued bowel obstruction. These results will be called to the ordering clinician or representative by the Radiologist Assistant, and communication documented in the PACS or zVision Dashboard. Electronically Signed   By: Sharlet Salina M.D.   On: 10/15/2019 20:23   DG Abd Portable 1V-Small Bowel Obstruction Protocol-initial, 8 hr delay  Result Date: 10/15/2019 CLINICAL DATA:  Small-bowel obstruction EXAM: PORTABLE ABDOMEN - 1 VIEW COMPARISON:  10/15/2019 7:33 a.m. FINDINGS: Two supine frontal views of the abdomen and pelvis demonstrate interval removal of the enteric catheter. There is increasing distention of the small bowel. Administered oral contrast accumulates within a dilated loop of bowel in the right mid abdomen, corresponding to a distended loop of small bowel seen on recent CT. I do not see any oral contrast definitively within the decompressed colon. Excreted urinary contrast from prior CT scan. IMPRESSION: 1. Progressive dilatation of the small bowel after enteric catheter removal. 2. Accumulation of oral contrast within the dilated loop of small bowel in the right mid abdomen, as seen on preceding CT exam. No colonic contrast on this study. Electronically Signed   By: Sharlet Salina M.D.   On: 10/15/2019 18:16   DG Abd Portable 1V-Small Bowel Protocol-Position Verification  Result Date: 10/15/2019 CLINICAL DATA:  69 year old male NG tube placement. EXAM: PORTABLE ABDOMEN - 1 VIEW COMPARISON:  CT Abdomen and Pelvis 0235 hours today. FINDINGS: Portable AP view at 0733 hours. Enteric tube placed into the stomach with side hole at the level of the gastric cardia. Negative lung bases. Excreted IV contrast in nondilated renal collecting systems and proximal ureters. Mildly improved bowel gas from earlier. No acute osseous abnormality identified. IMPRESSION: 1. Enteric tube placed into the stomach with satisfactory positioning. 2. Mildly improved bowel gas pattern from 0232 hours. Electronically  Signed   By: Odessa Fleming M.D.   On: 10/15/2019 08:45    Radiology CT ABDOMEN PELVIS W CONTRAST  Result Date: 10/22/2019 CLINICAL DATA:  Abdominal pain with nausea and vomiting EXAM: CT ABDOMEN AND PELVIS WITH CONTRAST TECHNIQUE: Multidetector CT imaging of the abdomen and pelvis was performed using the standard protocol following bolus administration of intravenous contrast. CONTRAST:  OMNIPAQUE IOHEXOL 300 MG/ML  SOLN COMPARISON:  10/15/2019 FINDINGS: LOWER CHEST: Normal. HEPATOBILIARY: Small amount of fluid along the anterior hepatic margin new since the prior study. Normal hepatic contours. No intra- or extrahepatic biliary dilatation. Normal gallbladder. PANCREAS: Normal pancreas. No ductal dilatation or peripancreatic fluid collection. SPLEEN: Normal. ADRENALS/URINARY TRACT: The adrenal glands are normal. No hydronephrosis, nephroureterolithiasis or solid renal mass. 3 cm left renal cyst. The urinary bladder is normal for degree of distention STOMACH/BOWEL: There is a sliding-type hiatal hernia within air-fluid level. The stomach is markedly distended. Small bowel remains diffusely dilated and fluid-filled. Right lower quadrant small bowel remains massively dilated measuring up to 10.7 cm in diameter. Much of the colon is decompressed. The appendix is not visible. Redemonstration of left mid abdominal surgical anastomosis. VASCULAR/LYMPHATIC: There is calcific atherosclerosis of the abdominal aorta. No abdominal or pelvic lymphadenopathy. REPRODUCTIVE: Normal prostate size with symmetric seminal vesicles. MUSCULOSKELETAL. No bony spinal canal stenosis or focal osseous abnormality. OTHER: None. IMPRESSION: 1. Diffusely fluid-filled, dilated small bowel, consistent with small bowel obstruction, little changed since 10/15/2019. 2. Small amount of free fluid along the anterior hepatic margin is new since the prior study. 3. Aortic Atherosclerosis (ICD10-I70.0). Electronically Signed   By: Deatra Robinson M.D.    On: 10/22/2019 02:18    Procedures Procedures (including critical care time)  Medications Ordered in ED Medications  sodium chloride flush (NS) 0.9 % injection 3 mL (3 mLs Intravenous Not Given 10/22/19 0103)  fentaNYL (SUBLIMAZE) injection 100 mcg (100 mcg Intravenous Given 10/22/19 0207)  iohexol (OMNIPAQUE) 300 MG/ML solution 100 mL (100 mLs Intravenous Contrast Given 10/22/19 0147)    ED Course  I have reviewed the triage vital signs and the nursing notes.  Pertinent labs & imaging results that were available during my care of the patient were reviewed by me and considered in my medical decision making (see chart for details).  NGT placed in the ED  Case d/w Dr. Cliffton Asters will admit to surgery.   Final Clinical Impression(s) / ED Diagnoses Final diagnoses:  SBO (small bowel obstruction) (HCC)       Elmarie Devlin, MD 10/22/19 [id number]

## 2019-10-22 NOTE — Anesthesia Preprocedure Evaluation (Addendum)
Anesthesia Evaluation  Patient identified by MRN, date of birth, ID band Patient awake    Reviewed: Allergy & Precautions, H&P , NPO status , Patient's Chart, lab work & pertinent test results  Airway Mallampati: II  TM Distance: >3 FB Neck ROM: full    Dental  (+) Missing, Poor Dentition, Dental Advisory Given,    Pulmonary Current Smoker,    breath sounds clear to auscultation       Cardiovascular negative cardio ROS   Rhythm:regular Rate:Normal     Neuro/Psych    GI/Hepatic SBO   Endo/Other    Renal/GU      Musculoskeletal   Abdominal   Peds  Hematology   Anesthesia Other Findings   Reproductive/Obstetrics                            Anesthesia Physical Anesthesia Plan  ASA: II  Anesthesia Plan: General   Post-op Pain Management:    Induction: Intravenous, Rapid sequence and Cricoid pressure planned  PONV Risk Score and Plan: 1 and Ondansetron, Dexamethasone and Treatment may vary due to age or medical condition  Airway Management Planned: Oral ETT  Additional Equipment:   Intra-op Plan:   Post-operative Plan: Extubation in OR  Informed Consent: I have reviewed the patients History and Physical, chart, labs and discussed the procedure including the risks, benefits and alternatives for the proposed anesthesia with the patient or authorized representative who has indicated his/her understanding and acceptance.       Plan Discussed with: Anesthesiologist, Surgeon and CRNA  Anesthesia Plan Comments:        Anesthesia Quick Evaluation

## 2019-10-22 NOTE — ED Triage Notes (Signed)
Pt says that he is having abdominal pain, vomiting. Reports he has a "blockage" that he needs surgery for.

## 2019-10-22 NOTE — Progress Notes (Signed)
Subjective/Chief Complaint: Patient continues with abdominal distention and pain. He states he is ready for surgery.   Objective: Vital signs in last 24 hours: Temp:  [98.1 F (36.7 C)-98.2 F (36.8 C)] 98.2 F (36.8 C) (02/19 0436) Pulse Rate:  [100-117] 106 (02/19 0436) Resp:  [16-20] 18 (02/19 0436) BP: (136-145)/(83-103) 136/88 (02/19 0436) SpO2:  [97 %-100 %] 100 % (02/19 0436) Weight:  [61.6 kg] 61.6 kg (02/19 0436) Last BM Date: 10/21/19  Intake/Output from previous day: 02/18 0701 - 02/19 0700 In: 102 [I.V.:102] Out: 2250 [Emesis/NG output:2250] Intake/Output this shift: No intake/output data recorded. . Constitutional: No acute distress, conversant, appears states age. Eyes: Anicteric sclerae, moist conjunctiva, no lid lag Lungs: Clear to auscultation bilaterally, normal respiratory effort CV: regular rate and rhythm, no murmurs, no peripheral edema, pedal pulses 2+ GI: Soft, no masses or hepatosplenomegaly, tender to palpation, distended, no rebound/guarding Skin: No rashes, palpation reveals normal turgor Psychiatric: appropriate judgment and insight, oriented to person, place, and time   Lab Results:  Recent Labs    10/22/19 0048  WBC 10.1  HGB 15.3  HCT 45.1  PLT 224   BMET Recent Labs    10/22/19 0048  NA 143  K 3.0*  CL 100  CO2 28  GLUCOSE 161*  BUN 15  CREATININE 1.41*  CALCIUM 9.6    Studies/Results: CT ABDOMEN PELVIS W CONTRAST  Result Date: 10/22/2019 CLINICAL DATA:  Abdominal pain with nausea and vomiting EXAM: CT ABDOMEN AND PELVIS WITH CONTRAST TECHNIQUE: Multidetector CT imaging of the abdomen and pelvis was performed using the standard protocol following bolus administration of intravenous contrast. CONTRAST:  OMNIPAQUE IOHEXOL 300 MG/ML  SOLN COMPARISON:  10/15/2019 FINDINGS: LOWER CHEST: Normal. HEPATOBILIARY: Small amount of fluid along the anterior hepatic margin new since the prior study. Normal hepatic contours. No  intra- or extrahepatic biliary dilatation. Normal gallbladder. PANCREAS: Normal pancreas. No ductal dilatation or peripancreatic fluid collection. SPLEEN: Normal. ADRENALS/URINARY TRACT: The adrenal glands are normal. No hydronephrosis, nephroureterolithiasis or solid renal mass. 3 cm left renal cyst. The urinary bladder is normal for degree of distention STOMACH/BOWEL: There is a sliding-type hiatal hernia within air-fluid level. The stomach is markedly distended. Small bowel remains diffusely dilated and fluid-filled. Right lower quadrant small bowel remains massively dilated measuring up to 10.7 cm in diameter. Much of the colon is decompressed. The appendix is not visible. Redemonstration of left mid abdominal surgical anastomosis. VASCULAR/LYMPHATIC: There is calcific atherosclerosis of the abdominal aorta. No abdominal or pelvic lymphadenopathy. REPRODUCTIVE: Normal prostate size with symmetric seminal vesicles. MUSCULOSKELETAL. No bony spinal canal stenosis or focal osseous abnormality. OTHER: None. IMPRESSION: 1. Diffusely fluid-filled, dilated small bowel, consistent with small bowel obstruction, little changed since 10/15/2019. 2. Small amount of free fluid along the anterior hepatic margin is new since the prior study. 3. Aortic Atherosclerosis (ICD10-I70.0). Electronically Signed   By: Deatra Robinson M.D.   On: 10/22/2019 02:18   DG Abd Portable 1V-Small Bowel Protocol-Position Verification  Result Date: 10/22/2019 CLINICAL DATA:  Nasogastric tube placement EXAM: PORTABLE ABDOMEN - 1 VIEW COMPARISON:  None. FINDINGS: Nasogastric tube tip and side port project over the stomach. There is excreted contrast material seen within the renal pelvises and ureters. Multiple loops of distended bowel, with gas filled stomach. IMPRESSION: Nasogastric tube tip and side port project in the stomach. Electronically Signed   By: Deatra Robinson M.D.   On: 10/22/2019 03:51   Assessment/Plan: 68 year old male, recurrent  small bowel obstruction 1.  I believe the patient might progress from his small bowel obstruction as this is been going on over the last several days to weeks.  This appears to be a chronic process.  Patient states he is ready for surgery and wants to have something done secondary to his abdominal distention and obstruction.  2.  We will proceed to the operating for ex lap, lysis of adhesions, possible bowel resection, possible ostomy.  I discussed with the patient the risks and benefits of the procedure to include but not limited to: Infection, bleeding, damage to surrounding structures, possible need for further surgery.  Patient was understanding wishes to proceed.  LOS: 0 days    Ralene Ok 10/22/2019

## 2019-10-22 NOTE — Anesthesia Procedure Notes (Signed)
Procedure Name: Intubation Date/Time: 10/22/2019 8:44 AM Performed by: Nils Pyle, CRNA Pre-anesthesia Checklist: Patient identified, Emergency Drugs available, Suction available and Patient being monitored Patient Re-evaluated:Patient Re-evaluated prior to induction Oxygen Delivery Method: Circle System Utilized Preoxygenation: Pre-oxygenation with 100% oxygen Induction Type: IV induction, Rapid sequence and Cricoid Pressure applied Laryngoscope Size: Miller and 2 Grade View: Grade I Tube type: Oral Tube size: 7.5 mm Number of attempts: 1 Airway Equipment and Method: Stylet and Oral airway Placement Confirmation: ETT inserted through vocal cords under direct vision,  positive ETCO2 and breath sounds checked- equal and bilateral Secured at: 23 cm Tube secured with: Tape Dental Injury: Teeth and Oropharynx as per pre-operative assessment

## 2019-10-22 NOTE — Plan of Care (Signed)

## 2019-10-22 NOTE — Transfer of Care (Signed)
Immediate Anesthesia Transfer of Care Note  Patient: Stuart Watts  Procedure(s) Performed: EXPLORATORY LAPAROTOMY (N/A Abdomen) LYSIS OF ADHESIONS  (Abdomen)  Patient Location: PACU  Anesthesia Type:General  Level of Consciousness: awake, alert  and oriented  Airway & Oxygen Therapy: Patient Spontanous Breathing  Post-op Assessment: Report given to RN, Post -op Vital signs reviewed and stable and Patient moving all extremities X 4  Post vital signs: Reviewed and stable  Last Vitals:  Vitals Value Taken Time  BP 219/88 10/22/19 1103  Temp    Pulse 71 10/22/19 1104  Resp 10 10/22/19 1104  SpO2 99 % 10/22/19 1104  Vitals shown include unvalidated device data.  Last Pain:  Vitals:   10/22/19 0440  TempSrc:   PainSc: 0-No pain         Complications: No apparent anesthesia complications

## 2019-10-22 NOTE — Progress Notes (Signed)
PHARMACY - TOTAL PARENTERAL NUTRITION CONSULT NOTE  Indication: SBO  Patient Measurements: Height: 5\' 11"  (180.3 cm) Weight: 135 lb 12.9 oz (61.6 kg) IBW/kg (Calculated) : 75.3 TPN AdjBW (KG): 61.6 Body mass index is 18.94 kg/m. Usual Weight: ~77 kg  Assessment: 79 YOM with history of multiple SBOs returned on 10/22/19 after a recent admission for SBO.  Patient has progressive abdominal distention, nausea and vomiting since discharge.  He underwent ex-lap with LoA and small bowel enterotomy repair on 2/19.  Patient has severe PCM and Pharmacy consulted to manage TPN.  Per documentation, patient has ongoing weight loss.    Glucose / Insulin: AM glucose 161 Electrolytes: K 3, others WNL Renal: SCr 1.41 (BL SCr 0.8-1), BUN WNL LFTs / TGs: LFTs WNL Prealbumin / albumin: N/A Intake / Output; MIVF: NG 3/19, D5NS at 100 ml/hr GI Imaging: none since TPN consult Surgeries / Procedures: none since TPN consult  Central access: PICC to be placed TPN start date: 10/23/19  Nutritional Goals (per RD rec on 2/19): 2200-2400 kCal, 120-135g protein, > 2.2L fluid per day  Current Nutrition:  NPO  Plan:  Start TPN tomorrow as consult was received after hour KCL x 4 runs Start sensitive SSI Q6H tomorrow Standard TPN labs and nursing care orders  Sharnetta Gielow D. 10-09-1984, PharmD, BCPS, BCCCP 10/22/2019, 3:02 PM

## 2019-10-22 NOTE — Progress Notes (Signed)
Pt. arrived to 6N03 from the ED. Pt. was able to scoot over from stretcher to the bed. VSS and pt. is alert and oriented x4. Pt. oriented to room and to call bell. Hooked NG tube up to low intermittent suction and immediately received 350 mL brown colored output. Pt. has no complaints at this time. Will continue to monitor.

## 2019-10-22 NOTE — Plan of Care (Signed)
  Problem: Education: Goal: Knowledge of General Education information will improve Description: Including pain rating scale, medication(s)/side effects and non-pharmacologic comfort measures Outcome: Progressing   Problem: Clinical Measurements: Goal: Ability to maintain clinical measurements within normal limits will improve Outcome: Progressing Goal: Will remain free from infection Outcome: Progressing Goal: Diagnostic test results will improve Outcome: Progressing Goal: Cardiovascular complication will be avoided Outcome: Progressing   Problem: Coping: Goal: Level of anxiety will decrease Outcome: Progressing

## 2019-10-22 NOTE — Progress Notes (Signed)
Casimiro Needle, PA notified of pulse 111. New orders placed.

## 2019-10-22 NOTE — Op Note (Signed)
10/22/2019  10:43 AM  PATIENT:  Stuart Watts  69 y.o. male  PRE-OPERATIVE DIAGNOSIS:  Bowel obstruction  POST-OPERATIVE DIAGNOSIS:  Bowel obstruction  PROCEDURE:  Procedure(s): EXPLORATORY LAPAROTOMY (N/A) Adhesiolysis x 90 minutes Small bowel enterotomy repair x1  SURGEON:  Surgeon(s) and Role:    Ralene Ok, MD - Primary   ASSISTANTS: Ewell Poe, RNFA   ANESTHESIA:   general  EBL:  50 mL   BLOOD ADMINISTERED:none  DRAINS: none   LOCAL MEDICATIONS USED:  NONE  SPECIMEN:  No Specimen  DISPOSITION OF SPECIMEN:  N/A  COUNTS:  YES  TOURNIQUET:  * No tourniquets in log *  DICTATION: .Dragon Dictation Indication procedure: Patient is a 69 year old male with history of multiple bowel junctions with surgery.  Patient states most recent laparotomy was in 2020 for small bowel obstruction.  Patient appeared to have bowel obstruction on CT scan on this admission that has been chronic with extensive small bowel dilatation and fluid.  Secondary to the above findings patient was taken back to the operating for exploratory laparotomy.  Findings: Patient had significant small bowel small bowel adhesions as well as adhesions to the lateral abdominal wall.  There was one band to the left upper quadrant that was lysed.  Small bowel small bowel adhesions were lysed in the area of the transition point could easily be seen that was tethered to the right lower quadrant.  This was freed up.  It should be noted there was an enterotomy upon entry as the small bowel was extremely dilated and densely adherent to the anterior abdominal wall.  This was repaired using interrupted 3 oh silks in a Lembert fashion.  The small bowel was dilated at the closure however there were no on collapsed portions of small bowel.  Details of procedure: After the patient was consented he was taken back to the OR and placed in the supine position with bilateral SCDs in place.  He underwent general  endotracheal intubation.  He was then prepped and draped in standard fashion.  A timeout was called and all facts verified.  A #10 blade was used to make a midline incision.  This was taken down to the anterior fascia.  At this time 2 Kocher was used to raise the fascia.  The area of the peritoneum was bluntly entered with a hemostat.  Upon doing so it was visualized that a portion of bowel was incised in the midline as this was densely adherent to the midline fascia.  At this time 3-0 silk was used in a Lembert fashion to reapproximate the enterotomy.  At this time I proceeded to lyse adhesions from the midline with sharp dissection to include Metzenbaum scissors as well as a #10 blade.  Portion of the peritoneum was taken down and left on the bowel.  Once I was able to take down the midline adhesions I was able to eviscerate the patient.  There was a band in the left upper quadrant that was tethered to the anterior lateral abdominal wall.  This was lysed.  This did not appear to be the area of transition.  I then was able to run the bowel from the ligament of Treitz distally.  It should be noted there was a large amount of dilatation of fluid-filled bowel.  Upon running the bowel from the ligament of Treitz distally.  There were several small bowel small bowel adhesions that were encountered.  These were lysed sharply with Metzenbaums scissors.  There was an  area in the right lower quadrant of small bowel small bowel adhesions were there was a transition point.  This was a kinking of the small bowel.  This adhesions were lysed.  At this time I was able to check the patency of the bowel which was excellent.  It should be noted that adhesiolysis was approximately 90 minutes.  At this time I was able to see the transverse and left colon.  These were completely collapsed.  The NG tube was in the proper position in the stomach.  At this time the small bowel was placed back into the abdominal cavity.  The mesentery  was checked and was laying appropriately.  At this time the midline fascia was reapproximated using #1 PDS in a standard running fashion x2.  The skin was reapproximated using skin staples.  The patient tired the procedure well was taken to recovery in stable condition.   PLAN OF CARE: Admit to inpatient   PATIENT DISPOSITION:  PACU - hemodynamically stable.   Delay start of Pharmacological VTE agent (>24hrs) due to surgical blood loss or risk of bleeding: no

## 2019-10-23 LAB — DIFFERENTIAL
Abs Immature Granulocytes: 0.04 10*3/uL (ref 0.00–0.07)
Basophils Absolute: 0 10*3/uL (ref 0.0–0.1)
Basophils Relative: 0 %
Eosinophils Absolute: 0 10*3/uL (ref 0.0–0.5)
Eosinophils Relative: 0 %
Immature Granulocytes: 0 %
Lymphocytes Relative: 14 %
Lymphs Abs: 1.3 10*3/uL (ref 0.7–4.0)
Monocytes Absolute: 0.7 10*3/uL (ref 0.1–1.0)
Monocytes Relative: 8 %
Neutro Abs: 6.9 10*3/uL (ref 1.7–7.7)
Neutrophils Relative %: 78 %

## 2019-10-23 LAB — CBC
HCT: 37.9 % — ABNORMAL LOW (ref 39.0–52.0)
Hemoglobin: 12.7 g/dL — ABNORMAL LOW (ref 13.0–17.0)
MCH: 32.5 pg (ref 26.0–34.0)
MCHC: 33.5 g/dL (ref 30.0–36.0)
MCV: 96.9 fL (ref 80.0–100.0)
Platelets: 201 K/uL (ref 150–400)
RBC: 3.91 MIL/uL — ABNORMAL LOW (ref 4.22–5.81)
RDW: 13.8 % (ref 11.5–15.5)
WBC: 9 K/uL (ref 4.0–10.5)
nRBC: 0 % (ref 0.0–0.2)

## 2019-10-23 LAB — BASIC METABOLIC PANEL
Anion gap: 13 (ref 5–15)
BUN: 16 mg/dL (ref 8–23)
CO2: 28 mmol/L (ref 22–32)
Calcium: 7.9 mg/dL — ABNORMAL LOW (ref 8.9–10.3)
Chloride: 104 mmol/L (ref 98–111)
Creatinine, Ser: 1.2 mg/dL (ref 0.61–1.24)
GFR calc Af Amer: >60 mL/min (ref >60)
GFR calc non Af Amer: >60 mL/min (ref >60)
Glucose, Bld: 140 mg/dL — ABNORMAL HIGH (ref 70–99)
Potassium: 3.7 mmol/L (ref 3.5–5.1)
Sodium: 145 mmol/L (ref 135–145)

## 2019-10-23 LAB — PREALBUMIN: Prealbumin: 13.3 mg/dL — ABNORMAL LOW (ref 18–38)

## 2019-10-23 LAB — MAGNESIUM
Magnesium: 0.9 mg/dL — CL (ref 1.7–2.4)
Magnesium: 1.8 mg/dL (ref 1.7–2.4)

## 2019-10-23 LAB — PHOSPHORUS: Phosphorus: 3.4 mg/dL (ref 2.5–4.6)

## 2019-10-23 LAB — GLUCOSE, CAPILLARY: Glucose-Capillary: 100 mg/dL — ABNORMAL HIGH (ref 70–99)

## 2019-10-23 LAB — TRIGLYCERIDES: Triglycerides: 29 mg/dL

## 2019-10-23 MED ORDER — TRAVASOL 10 % IV SOLN
INTRAVENOUS | Status: AC
  Filled 2019-10-23: qty 414

## 2019-10-23 MED ORDER — SODIUM CHLORIDE 0.9% FLUSH
10.0000 mL | INTRAVENOUS | Status: DC | PRN
  Administered 2019-10-26 (×2): 10 mL

## 2019-10-23 MED ORDER — MAGNESIUM SULFATE 4 GM/100ML IV SOLN
4.0000 g | INTRAVENOUS | Status: AC
  Administered 2019-10-23: 4 g via INTRAVENOUS
  Filled 2019-10-23: qty 100

## 2019-10-23 MED ORDER — DEXTROSE-NACL 5-0.45 % IV SOLN: INTRAVENOUS | Status: AC

## 2019-10-23 MED ORDER — CALCIUM GLUCONATE-NACL 1-0.675 GM/50ML-% IV SOLN
1.0000 g | Freq: Once | INTRAVENOUS | Status: AC
  Administered 2019-10-23: 1000 mg via INTRAVENOUS
  Filled 2019-10-23: qty 50

## 2019-10-23 MED ORDER — POTASSIUM CHLORIDE 10 MEQ/100ML IV SOLN
10.0000 meq | INTRAVENOUS | Status: AC
  Administered 2019-10-23 (×2): 10 meq via INTRAVENOUS
  Filled 2019-10-23 (×2): qty 100

## 2019-10-23 MED ORDER — MAGNESIUM SULFATE IN D5W 1-5 GM/100ML-% IV SOLN
1.0000 g | Freq: Once | INTRAVENOUS | Status: AC
  Administered 2019-10-23: 1 g via INTRAVENOUS
  Filled 2019-10-23: qty 100

## 2019-10-23 NOTE — Progress Notes (Signed)
Dr. Dwain Sarna notified of critical lab- Magnesium 0.9

## 2019-10-23 NOTE — Progress Notes (Signed)
Patient ID: Stuart Watts, male   DOB: November 09, 1950, 69 y.o.   MRN: [id number] [facility] Progress Note:   1 Day Post-Op  Subjective: Mental status is fairly clear.  He is asking questions.   Objective: Vital signs in last 24 hours: Temp:  [97 F (36.1 C)-98.9 F (37.2 C)] 98.4 F (36.9 C) (02/20 0802) Pulse Rate:  [67-125] 115 (02/20 0802) Resp:  [14-20] 20 (02/20 0802) BP: (151-203)/(74-145) 156/79 (02/20 0802) SpO2:  [94 %-100 %] 97 % (02/20 0802) Weight:  [59.6 kg-61.3 kg] 59.6 kg (02/19 2256)  Intake/Output from previous day: 02/19 0701 - 02/20 0700 In: 4050.9 [I.V.:3342.9; NG/GT:50; IV Piggyback:658] Out: 3005 [Urine:805; Emesis/NG output:1700; Blood:50] Intake/Output this shift: No intake/output data recorded.  Physical Exam: Work of breathing is OK.  NG in place and no flatus.    Lab Results:  Results for orders placed or performed during the hospital encounter of 10/22/19 (from the past 48 hour(s))  Lipase, blood     Status: None   Collection Time: 10/22/19 12:48 AM  Result Value Ref Range   Lipase 21 11 - 51 U/L    Comment: Performed at [facility], [address] [place], [place] [address]  Comprehensive metabolic panel     Status: Abnormal   Collection Time: 10/22/19 12:48 AM  Result Value Ref Range   Sodium 143 135 - 145 mmol/L   Potassium 3.0 (L) 3.5 - 5.1 mmol/L   Chloride 100 98 - 111 mmol/L   CO2 28 22 - 32 mmol/L   Glucose, Bld 161 (H) 70 - 99 mg/dL   BUN 15 8 - 23 mg/dL   Creatinine, Ser 8.65 (H) 0.61 - 1.24 mg/dL   Calcium 9.6 8.9 - 78.4 mg/dL   Total Protein 7.6 6.5 - 8.1 g/dL   Albumin 4.3 3.5 - 5.0 g/dL   AST 15 15 - 41 U/L   ALT 18 0 - 44 U/L   Alkaline Phosphatase 76 38 - 126 U/L   Total Bilirubin 1.0 0.3 - 1.2 mg/dL   GFR calc non Af Amer 50 (L) >60 mL/min   GFR calc Af Amer 58 (L) >60 mL/min   Anion gap 15 5 - 15    Comment: Performed at [facility], [address] [place], [place] [address]  CBC      Status: None   Collection Time: 10/22/19 12:48 AM  Result Value Ref Range   WBC 10.1 4.0 - 10.5 K/uL   RBC 4.67 4.22 - 5.81 MIL/uL   Hemoglobin 15.3 13.0 - 17.0 g/dL   HCT 52.8 41.3 - 24.4 %   MCV 96.6 80.0 - 100.0 fL   MCH 32.8 26.0 - 34.0 pg   MCHC 33.9 30.0 - 36.0 g/dL   RDW 01.0 27.2 - 53.6 %   Platelets 224 150 - 400 K/uL   nRBC 0.0 0.0 - 0.2 %    Comment: Performed at [facility], [address], [place], [place] [address]  Respiratory Panel by RT PCR (Flu A&B, Covid) - Nasopharyngeal Swab     Status: None   Collection Time: 10/22/19  1:07 AM   Specimen: Nasopharyngeal Swab  Result Value Ref Range   SARS Coronavirus 2 by RT PCR NEGATIVE NEGATIVE    Comment: (NOTE) SARS-CoV-2 target nucleic acids are NOT DETECTED. The SARS-CoV-2 RNA is generally detectable in upper respiratoy specimens during the acute phase of infection. The lowest concentration of SARS-CoV-2 viral copies this assay  can detect is 131 copies/mL. A negative result does not preclude SARS-Cov-2 infection and should not be used as the sole basis for treatment or other patient management decisions. A negative result may occur with  improper specimen collection/handling, submission of specimen other than nasopharyngeal swab, presence of viral mutation(s) within the areas targeted by this assay, and inadequate number of viral copies (<131 copies/mL). A negative result must be combined with clinical observations, patient history, and epidemiological information. The expected result is Negative. Fact Sheet for Patients:  PinkCheek.be Fact Sheet for Healthcare Providers:  GravelBags.it This test is not yet ap proved or cleared by the [place] FDA and  has been authorized for detection and/or diagnosis of SARS-CoV-2 by FDA under an Emergency Use Authorization (EUA). This EUA will remain  in effect (meaning this test can be used) for the duration of  the COVID-19 declaration under Section 564(b)(1) of the Act, 21 U.S.C. section 360bbb-3(b)(1), unless the authorization is terminated or revoked sooner.    Influenza A by PCR NEGATIVE NEGATIVE   Influenza B by PCR NEGATIVE NEGATIVE    Comment: (NOTE) The Xpert Xpress SARS-CoV-2/FLU/RSV assay is intended as an aid in  the diagnosis of influenza from Nasopharyngeal swab specimens and  should not be used as a sole basis for treatment. Nasal washings and  aspirates are unacceptable for Xpert Xpress SARS-CoV-2/FLU/RSV  testing. Fact Sheet for Patients: PinkCheek.be Fact Sheet for Healthcare Providers: GravelBags.it This test is not yet approved or cleared by the [place] FDA and  has been authorized for detection and/or diagnosis of SARS-CoV-2 by  FDA under an Emergency Use Authorization (EUA). This EUA will remain  in effect (meaning this test can be used) for the duration of the  Covid-19 declaration under Section 564(b)(1) of the Act, 21  U.S.C. section 360bbb-3(b)(1), unless the authorization is  terminated or revoked. Performed at [facility], [place] [address] [place], [place] [address]   Surgical pcr screen     Status: None   Collection Time: 10/22/19  7:53 AM   Specimen: Nasal Mucosa; Nasal Swab  Result Value Ref Range   MRSA, PCR NEGATIVE NEGATIVE   Staphylococcus aureus NEGATIVE NEGATIVE    Comment: (NOTE) The Xpert SA Assay (FDA approved for NASAL specimens in patients 37 years of age and older), is one component of a comprehensive surveillance program. It is not intended to diagnose infection nor to guide or monitor treatment. Performed at [facility], [place] [address], [place], [place] [address]   Type and screen [facility]     Status: None   Collection Time: 10/22/19  8:15 AM  Result Value Ref Range   ABO/RH(D) A POS    Antibody Screen NEG    Sample Expiration       10/25/2019,2359 Performed at [facility], [place] [address], [place], [place] [address]   ABO/Rh     Status: None   Collection Time: 10/22/19  8:15 AM  Result Value Ref Range   ABO/RH(D)      A POS Performed at [place] [address] [place],  [address]   Basic metabolic panel     Status: Abnormal   Collection Time: 10/23/19  2:44 AM  Result Value Ref Range   Sodium 145 135 - 145 mmol/L   Potassium 3.7 3.5 - 5.1 mmol/L    Comment: NO VISIBLE HEMOLYSIS   Chloride 104 98 - 111 mmol/L   CO2 28  22 - 32 mmol/L   Glucose, Bld 140 (H) 70 - 99 mg/dL   BUN 16 8 - 23 mg/dL   Creatinine, Ser 4.31 0.61 - 1.24 mg/dL   Calcium 7.9 (L) 8.9 - 10.3 mg/dL   GFR calc non Af Amer >60 >60 mL/min   GFR calc Af Amer >60 >60 mL/min   Anion gap 13 5 - 15    Comment: Performed at [facility], [address] [place], [place] [address]  CBC     Status: Abnormal   Collection Time: 10/23/19  2:44 AM  Result Value Ref Range   WBC 9.0 4.0 - 10.5 K/uL   RBC 3.91 (L) 4.22 - 5.81 MIL/uL   Hemoglobin 12.7 (L) 13.0 - 17.0 g/dL   HCT 67.6 (L) 19.5 - 09.3 %   MCV 96.9 80.0 - 100.0 fL   MCH 32.5 26.0 - 34.0 pg   MCHC 33.5 30.0 - 36.0 g/dL   RDW 26.7 12.4 - 58.0 %   Platelets 201 150 - 400 K/uL   nRBC 0.0 0.0 - 0.2 %    Comment: Performed at [facility], [address] [place], [place] [address]  Prealbumin     Status: Abnormal   Collection Time: 10/23/19  2:44 AM  Result Value Ref Range   Prealbumin 13.3 (L) 18 - 38 mg/dL    Comment: Performed at [facility], [address], [place], [place] [address]  Magnesium     Status: Abnormal   Collection Time: 10/23/19  2:44 AM  Result Value Ref Range   Magnesium 0.9 (LL) 1.7 - 2.4 mg/dL    Comment: CRITICAL RESULT CALLED TO, READ BACK BY AND VERIFIED WITH: P MOSSE,RN 10/23/2019 0439 WILDERK Performed at [facility], [address], [place], [place] [address]   Phosphorus     Status: None   Collection Time:  10/23/19  2:44 AM  Result Value Ref Range   Phosphorus 3.4 2.5 - 4.6 mg/dL    Comment: Performed at [facility], [address], [place], [place] [address]  Differential     Status: None   Collection Time: 10/23/19  2:44 AM  Result Value Ref Range   Neutrophils Relative % 78 %   Neutro Abs 6.9 1.7 - 7.7 K/uL   Lymphocytes Relative 14 %   Lymphs Abs 1.3 0.7 - 4.0 K/uL   Monocytes Relative 8 %   Monocytes Absolute 0.7 0.1 - 1.0 K/uL   Eosinophils Relative 0 %   Eosinophils Absolute 0.0 0.0 - 0.5 K/uL   Basophils Relative 0 %   Basophils Absolute 0.0 0.0 - 0.1 K/uL   Immature Granulocytes 0 %   Abs Immature Granulocytes 0.04 0.00 - 0.07 K/uL    Comment: Performed at [facility], [address], [place], [place] [address]  Triglycerides     Status: None   Collection Time: 10/23/19  2:44 AM  Result Value Ref Range   Triglycerides 29 <150 mg/dL    Comment: Performed at [facility], [address] [place], [place] [address]    Radiology/Results: CT ABDOMEN PELVIS W CONTRAST  Result Date: 10/22/2019 CLINICAL DATA:  Abdominal pain with nausea and vomiting EXAM: CT ABDOMEN AND PELVIS WITH CONTRAST TECHNIQUE: Multidetector CT imaging of the abdomen and pelvis was performed using the standard protocol following bolus administration of intravenous contrast. CONTRAST:  OMNIPAQUE IOHEXOL 300 MG/ML  SOLN COMPARISON:  10/15/2019 FINDINGS: LOWER  CHEST: Normal. HEPATOBILIARY: Small amount of fluid along the anterior hepatic margin new since the prior study. Normal hepatic contours. No intra- or extrahepatic biliary dilatation. Normal gallbladder. PANCREAS: Normal pancreas. No ductal dilatation or peripancreatic fluid collection. SPLEEN: Normal. ADRENALS/URINARY TRACT: The adrenal glands are normal. No hydronephrosis, nephroureterolithiasis or solid renal mass. 3 cm left renal cyst. The urinary bladder is normal for degree of distention STOMACH/BOWEL: There is a sliding-type  hiatal hernia within air-fluid level. The stomach is markedly distended. Small bowel remains diffusely dilated and fluid-filled. Right lower quadrant small bowel remains massively dilated measuring up to 10.7 cm in diameter. Much of the colon is decompressed. The appendix is not visible. Redemonstration of left mid abdominal surgical anastomosis. VASCULAR/LYMPHATIC: There is calcific atherosclerosis of the abdominal aorta. No abdominal or pelvic lymphadenopathy. REPRODUCTIVE: Normal prostate size with symmetric seminal vesicles. MUSCULOSKELETAL. No bony spinal canal stenosis or focal osseous abnormality. OTHER: None. IMPRESSION: 1. Diffusely fluid-filled, dilated small bowel, consistent with small bowel obstruction, little changed since 10/15/2019. 2. Small amount of free fluid along the anterior hepatic margin is new since the prior study. 3. Aortic Atherosclerosis (ICD10-I70.0). Electronically Signed   By: Deatra Robinson M.D.   On: 10/22/2019 02:18   DG Abd Portable 1V-Small Bowel Protocol-Position Verification  Result Date: 10/22/2019 CLINICAL DATA:  Nasogastric tube placement EXAM: PORTABLE ABDOMEN - 1 VIEW COMPARISON:  None. FINDINGS: Nasogastric tube tip and side port project over the stomach. There is excreted contrast material seen within the renal pelvises and ureters. Multiple loops of distended bowel, with gas filled stomach. IMPRESSION: Nasogastric tube tip and side port project in the stomach. Electronically Signed   By: Deatra Robinson M.D.   On: 10/22/2019 03:51   [place] EKG SITE RITE  Result Date: 10/22/2019 If Site Rite image not attached, placement could not be confirmed due to current cardiac rhythm.   Anti-infectives: Anti-infectives (From admission, onward)   Start     Dose/Rate Route Frequency Ordered Stop   10/22/19 0800  cefoTEtan (CEFOTAN) 2 g in sodium chloride 0.9 % 100 mL IVPB     2 g 200 mL/hr over 30 Minutes Intravenous On call to O.R. 10/22/19 0753 10/23/19 0649       Assessment/Plan: Problem List: Patient Active Problem List   Diagnosis Date Noted  . 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 Protein-calorie malnutrition, moderate (HCC) 10/16/2019  . Noncompliance with NG tube 10/16/2019  . History of small bowel obstruction 10/16/2019  . SBO (small bowel obstruction) (HCC) 10/15/2019   NG suction for expected postop ileus.   1 Day Post-Op    LOS: 1 day   Matt B. Daphine Deutscher, MD, [facility] [phone] beeper [phone]  10/23/2019 9:04 AM

## 2019-10-23 NOTE — Progress Notes (Signed)
Peripherally Inserted Central Catheter/Midline Placement  The IV Nurse has discussed with the patient and/or persons authorized to consent for the patient, the purpose of this procedure and the potential benefits and risks involved with this procedure.  The benefits include less needle sticks, lab draws from the catheter, and the patient may be discharged home with the catheter. Risks include, but not limited to, infection, bleeding, blood clot (thrombus formation), and puncture of an artery; nerve damage and irregular heartbeat and possibility to perform a PICC exchange if needed/ordered by physician.  Alternatives to this procedure were also discussed.  Bard Power PICC patient education guide, fact sheet on infection prevention and patient information card has been provided to patient /or left at bedside.    PICC/Midline Placement Documentation  PICC Double Lumen 10/23/19 PICC Right Cephalic 36 cm 0 cm (Active)  Indication for Insertion or Continuance of Line Administration of hyperosmolar/irritating solutions (i.e. TPN, Vancomycin, etc.) 10/23/19 0923  Exposed Catheter (cm) 0 cm 10/23/19 5670  Site Assessment Clean;Dry;Intact 10/23/19 0923  Lumen #1 Status Flushed;Blood return noted 10/23/19 0923  Lumen #2 Status Flushed;Blood return noted 10/23/19 1410  Dressing Type Transparent 10/23/19 0923  Dressing Status Clean;Dry;Intact;Antimicrobial disc in place;Other (Comment) 10/23/19 0923  Dressing Intervention New dressing 10/23/19 0923  Dressing Change Due 10/30/19 10/23/19 0923       Reginia Forts Albarece 10/23/2019, 9:24 AM

## 2019-10-23 NOTE — Progress Notes (Addendum)
PHARMACY - TOTAL PARENTERAL NUTRITION CONSULT NOTE  Indication: SBO  Patient Measurements: Height: 5\' 11"  (180.3 cm) Weight: 131 lb 6.3 oz (59.6 kg) IBW/kg (Calculated) : 75.3 TPN AdjBW (KG): 61.6 Body mass index is 18.33 kg/m. Usual Weight: ~77 kg  Assessment: 74 YOM with history of multiple SBOs returned on 10/22/19 after a recent admission for SBO.  Patient has progressive abdominal distention, nausea and vomiting since discharge.  He underwent ex-lap with LoA and small bowel enterotomy repair on 2/19.  Patient has severe PCM and Pharmacy consulted to manage TPN.  Per documentation, patient has ongoing weight loss.   Glucose / Insulin: no hx DM - AM glucose < 180 Electrolytes: K 3.7 post 4 runs, Mag 0.9, Na high normal, Ca low at 7.9 Renal: SCr down to 1.2 (BL SCr 0.8-1), BUN WNL LFTs / TGs: LFTs / tbili / TG WNL Prealbumin / albumin: BL prealbumin low at 13.3, albumin WNL Intake / Output; MIVF: UOP 0.6 ml/kg/hr, NG 3/19, D5NS at 100 ml/hr, net -1.1L GI Imaging: none since TPN consult Surgeries / Procedures: none since TPN consult  Central access: PICC to be placed 2/20 TPN start date: 10/23/19  Nutritional Goals (per RD rec on 2/19): 2200-2400 kCal, 120-135g protein, > 2.2L fluid per day  Current Nutrition:  NPO  Plan:  Initiate TPN at 30 ml/hr (goal rate 95 ml/hr) TPN will provide 41g AA, 101g CHO and 22g ILE for a total of 732 kCal, meeting ~32% of patient needs Electrolytes in TPN: standard except increased Ca and Mag, Cl:Ac 1:1 Daily trace elements and MWF multivitamin in TPN d/t national shortage KCL x 2 runs to prevent refeeding complications Mag sulfate 4mg  IV x 1 stat.  Repeat magnesium level at 12N. Ca gluconate 1gm IV x 1 Start sensitive SSI Q6H  Change IVF from D5NS to D51/2NS and reduce to 70 ml/hr when TPN starts F/U AM labs  Stuart Wallner D. 3/19, PharmD, BCPS, BCCCP 10/23/2019, 7:41 AM  ====================================  Addendum: Mag 1.8 post 4gm  IV Mag sulfate 1gm IV x 1 F/U AM labs  Stuart Kawai D. 10/25/2019, PharmD, BCPS, BCCCP 10/23/2019, 2:29 PM

## 2019-10-23 NOTE — Progress Notes (Signed)
IV team having difficulty drawing blood from new placed picc line of the patient for scheduled lab draw. Tried Lab to draw but patient refused. IV team consult placed to try again. Awaiting IV team. MD paged and aware.

## 2019-10-24 LAB — BASIC METABOLIC PANEL
Anion gap: 10 (ref 5–15)
BUN: 14 mg/dL (ref 8–23)
CO2: 30 mmol/L (ref 22–32)
Calcium: 8.2 mg/dL — ABNORMAL LOW (ref 8.9–10.3)
Chloride: 105 mmol/L (ref 98–111)
Creatinine, Ser: 0.93 mg/dL (ref 0.61–1.24)
GFR calc Af Amer: >60 mL/min (ref >60)
GFR calc non Af Amer: >60 mL/min (ref >60)
Glucose, Bld: 134 mg/dL — ABNORMAL HIGH (ref 70–99)
Potassium: 3.5 mmol/L (ref 3.5–5.1)
Sodium: 145 mmol/L (ref 135–145)

## 2019-10-24 LAB — GLUCOSE, CAPILLARY
Glucose-Capillary: 120 mg/dL — ABNORMAL HIGH (ref 70–99)
Glucose-Capillary: 123 mg/dL — ABNORMAL HIGH (ref 70–99)
Glucose-Capillary: 138 mg/dL — ABNORMAL HIGH (ref 70–99)
Glucose-Capillary: 143 mg/dL — ABNORMAL HIGH (ref 70–99)
Glucose-Capillary: 97 mg/dL (ref 70–99)

## 2019-10-24 LAB — PHOSPHORUS: Phosphorus: 1.7 mg/dL — ABNORMAL LOW (ref 2.5–4.6)

## 2019-10-24 LAB — MAGNESIUM: Magnesium: 1.8 mg/dL (ref 1.7–2.4)

## 2019-10-24 MED ORDER — DEXTROSE-NACL 5-0.45 % IV SOLN: INTRAVENOUS | Status: AC

## 2019-10-24 MED ORDER — MAGNESIUM SULFATE 2 GM/50ML IV SOLN
2.0000 g | Freq: Once | INTRAVENOUS | Status: AC
  Administered 2019-10-24: 2 g via INTRAVENOUS
  Filled 2019-10-24: qty 50

## 2019-10-24 MED ORDER — POTASSIUM PHOSPHATES 15 MMOLE/5ML IV SOLN
30.0000 mmol | Freq: Once | INTRAVENOUS | Status: AC
  Administered 2019-10-24: 30 mmol via INTRAVENOUS
  Filled 2019-10-24: qty 10

## 2019-10-24 MED ORDER — CALCIUM GLUCONATE-NACL 1-0.675 GM/50ML-% IV SOLN
1.0000 g | Freq: Once | INTRAVENOUS | Status: AC
  Administered 2019-10-24: 1000 mg via INTRAVENOUS
  Filled 2019-10-24: qty 50

## 2019-10-24 MED ORDER — TRAVASOL 10 % IV SOLN
INTRAVENOUS | Status: AC
  Filled 2019-10-24: qty 828

## 2019-10-24 MED ORDER — ACETAMINOPHEN 10 MG/ML IV SOLN
1000.0000 mg | Freq: Four times a day (QID) | INTRAVENOUS | Status: DC
  Administered 2019-10-24 – 2019-10-25 (×3): 1000 mg via INTRAVENOUS
  Filled 2019-10-24 (×3): qty 100

## 2019-10-24 NOTE — Progress Notes (Signed)
PHARMACY - TOTAL PARENTERAL NUTRITION CONSULT NOTE  Indication: SBO  Patient Measurements: Height: 5\' 11"  (180.3 cm) Weight: 131 lb 6.3 oz (59.6 kg) IBW/kg (Calculated) : 75.3 TPN AdjBW (KG): 61.6 Body mass index is 18.33 kg/m. Usual Weight: ~77 kg  Assessment: 85 YOM with history of multiple SBOs returned on 10/22/19 after a recent admission for SBO.  Patient has progressive abdominal distention, nausea and vomiting since discharge.  He underwent ex-lap with LoA and small bowel enterotomy repair on 2/19.  Patient has severe PCM and Pharmacy consulted to manage TPN.  Per documentation, patient has ongoing weight loss PTA.   Glucose / Insulin: no hx DM - CBGs < 180 Required 1 unit SSI since TPN started Electrolytes: refeeding - K down to 3.5 post 2 runs, Mag improved to 1.8 post 5gm outside of TPN, Na high normal, Ca low at 8.2, Phos down to 1.7 Renal: SCr down to 0.93, BUN WNL LFTs / TGs: LFTs / tbili / TG WNL Prealbumin / albumin: BL prealbumin low at 13.3, albumin WNL Intake / Output; MIVF: UOP 0.4 ml/kg/hr, NG 3/19 (down), D5NS at 70 ml/hr, net -1.1L GI Imaging: none since TPN consult Surgeries / Procedures: none since TPN consult  Central access: PICC placed 10/23/19 TPN start date: 10/23/19  Nutritional Goals (per RD rec on 2/19): 2200-2400 kCal, 120-135g protein, > 2.2L fluid per day  Current Nutrition:  TPN  Plan:  Increase TPN to 60 ml/hr (goal rate 95 ml/hr).  Advance to goal pending lytes. TPN will provide 83g AA, 202g CHO and 45g ILE for a total of 1464 kCal, meeting ~63% of patient needs Electrolytes in TPN: reduced Na - other lytes increase with increased TPN rate, Cl:Ac 1:1 Daily trace elements and MWF multivitamin in TPN d/t national shortage KPhos 30 mmol IV x 1 Mag sulfate 2mg  IV x 1 Ca gluconate 1gm IV x 1 Continue sensitive SSI Q6H.  D/C if CBGs remain controlled at goal TPN rate. Reduce D51/2NS to 40 ml/hr when new TPN bag starts F/U AM labs  Hulbert Branscome D.  3/19, PharmD, BCPS, BCCCP 10/24/2019, 7:52 AM

## 2019-10-24 NOTE — Progress Notes (Signed)
Patient ID: Stuart Watts, male   DOB: 08-04-1951, 69 y.o.   MRN: [id number] [facility] Progress Note:   2 Days Post-Op  Subjective: Mental status is clear and alert;   Objective: Vital signs in last 24 hours: Temp:  [97.6 F (36.4 C)-99.6 F (37.6 C)] 97.6 F (36.4 C) (02/21 0912) Pulse Rate:  [98-118] 106 (02/21 0912) Resp:  [20-22] 22 (02/21 0912) BP: (134-159)/(65-80) 155/78 (02/21 0912) SpO2:  [91 %-98 %] 97 % (02/21 0912)  Intake/Output from previous day: 02/20 0701 - 02/21 0700 In: 1164 [I.V.:998.2; IV Piggyback:165.9] Out: 1300 [Urine:600; Emesis/NG output:700] Intake/Output this shift: No intake/output data recorded.  Physical Exam: Work of breathing is not labored.  NG in place.  No appreciable flatus.    Lab Results:  Results for orders placed or performed during the hospital encounter of 10/22/19 (from the past 48 hour(s))  Basic metabolic panel     Status: Abnormal   Collection Time: 10/23/19  2:44 AM  Result Value Ref Range   Sodium 145 135 - 145 mmol/L   Potassium 3.7 3.5 - 5.1 mmol/L    Comment: NO VISIBLE HEMOLYSIS   Chloride 104 98 - 111 mmol/L   CO2 28 22 - 32 mmol/L   Glucose, Bld 140 (H) 70 - 99 mg/dL   BUN 16 8 - 23 mg/dL   Creatinine, Ser 0.17 0.61 - 1.24 mg/dL   Calcium 7.9 (L) 8.9 - 10.3 mg/dL   GFR calc non Af Amer >60 >60 mL/min   GFR calc Af Amer >60 >60 mL/min   Anion gap 13 5 - 15    Comment: Performed at [facility], [address], [place], [place] [address]  CBC     Status: Abnormal   Collection Time: 10/23/19  2:44 AM  Result Value Ref Range   WBC 9.0 4.0 - 10.5 K/uL   RBC 3.91 (L) 4.22 - 5.81 MIL/uL   Hemoglobin 12.7 (L) 13.0 - 17.0 g/dL   HCT 67.5 (L) 91.6 - 38.4 %   MCV 96.9 80.0 - 100.0 fL   MCH 32.5 26.0 - 34.0 pg   MCHC 33.5 30.0 - 36.0 g/dL   RDW 66.5 99.3 - 57.0 %   Platelets 201 150 - 400 K/uL   nRBC 0.0 0.0 - 0.2 %    Comment: Performed at [facility], [address] [place], [place]  [address]  Prealbumin     Status: Abnormal   Collection Time: 10/23/19  2:44 AM  Result Value Ref Range   Prealbumin 13.3 (L) 18 - 38 mg/dL    Comment: Performed at [facility], [address], [place], [place] [address]  Magnesium     Status: Abnormal   Collection Time: 10/23/19  2:44 AM  Result Value Ref Range   Magnesium 0.9 (LL) 1.7 - 2.4 mg/dL    Comment: CRITICAL RESULT CALLED TO, READ BACK BY AND VERIFIED WITH: P MOSSE,RN 10/23/2019 0439 WILDERK Performed at [facility], [address] [place], [place] [address]   Phosphorus     Status: None   Collection Time: 10/23/19  2:44 AM  Result Value Ref Range   Phosphorus 3.4 2.5 - 4.6 mg/dL    Comment: Performed at [facility], [address] [place], [place] [address]  Differential     Status: None   Collection Time: 10/23/19  2:44 AM  Result Value Ref Range   Neutrophils Relative % 78 %  Neutro Abs 6.9 1.7 - 7.7 K/uL   Lymphocytes Relative 14 %   Lymphs Abs 1.3 0.7 - 4.0 K/uL   Monocytes Relative 8 %   Monocytes Absolute 0.7 0.1 - 1.0 K/uL   Eosinophils Relative 0 %   Eosinophils Absolute 0.0 0.0 - 0.5 K/uL   Basophils Relative 0 %   Basophils Absolute 0.0 0.0 - 0.1 K/uL   Immature Granulocytes 0 %   Abs Immature Granulocytes 0.04 0.00 - 0.07 K/uL    Comment: Performed at [facility] [address] [place], [place] [address]  Triglycerides     Status: None   Collection Time: 10/23/19  2:44 AM  Result Value Ref Range   Triglycerides 29 <150 mg/dL    Comment: Performed at [facility] [address] [place], [place] [address]  Magnesium     Status: None   Collection Time: 10/23/19  1:25 PM  Result Value Ref Range   Magnesium 1.8 1.7 - 2.4 mg/dL    Comment: Performed at [facility] [address], [place], [place] [address]  Glucose, capillary     Status: Abnormal   Collection Time: 10/23/19  5:22 PM  Result Value Ref Range   Glucose-Capillary 100 (H) 70 - 99 mg/dL  Glucose,  capillary     Status: Abnormal   Collection Time: 10/24/19 12:36 AM  Result Value Ref Range   Glucose-Capillary 120 (H) 70 - 99 mg/dL  Basic metabolic panel     Status: Abnormal   Collection Time: 10/24/19  3:38 AM  Result Value Ref Range   Sodium 145 135 - 145 mmol/L   Potassium 3.5 3.5 - 5.1 mmol/L   Chloride 105 98 - 111 mmol/L   CO2 30 22 - 32 mmol/L   Glucose, Bld 134 (H) 70 - 99 mg/dL   BUN 14 8 - 23 mg/dL   Creatinine, Ser 0.93 0.61 - 1.24 mg/dL   Calcium 8.2 (L) 8.9 - 10.3 mg/dL   GFR calc non Af Amer >60 >60 mL/min   GFR calc Af Amer >60 >60 mL/min   Anion gap 10 5 - 15    Comment: Performed at [facility], [place] [address], [place], [place] [address]  Magnesium     Status: None   Collection Time: 10/24/19  3:38 AM  Result Value Ref Range   Magnesium 1.8 1.7 - 2.4 mg/dL    Comment: Performed at [facility] [address], [place], [place] [address]  Phosphorus     Status: Abnormal   Collection Time: 10/24/19  3:38 AM  Result Value Ref Range   Phosphorus 1.7 (L) 2.5 - 4.6 mg/dL    Comment: Performed at [facility] [address], [place], [place] [address]  Glucose, capillary     Status: Abnormal   Collection Time: 10/24/19  5:21 AM  Result Value Ref Range   Glucose-Capillary 138 (H) 70 - 99 mg/dL    Radiology/Results: [place] EKG SITE RITE  Result Date: 10/22/2019 If Site Rite image not attached, placement could not be confirmed due to current cardiac rhythm.   Anti-infectives: Anti-infectives (From admission, onward)   Start     Dose/Rate Route Frequency Ordered Stop   10/22/19 0800  cefoTEtan (CEFOTAN) 2 g in sodium chloride 0.9 % 100 mL IVPB     2 g 200 mL/hr over 30 Minutes Intravenous On call to O.R. 10/22/19 7672 10/23/19 0947  Assessment/Plan: Problem List: Patient Active Problem List   Diagnosis Date Noted  . 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 Protein-calorie  malnutrition, moderate (HCC) 10/16/2019  . Noncompliance with NG tube 10/16/2019  . History of small bowel obstruction 10/16/2019  . SBO (small bowel obstruction) (HCC) 10/15/2019    Will offer sham feeding --[place] ice given and apple juice OK with NG to suction.   2 Days Post-Op    LOS: 2 days   Matt B. Daphine Deutscher, MD, [facility] [phone] beeper [phone]  10/24/2019 9:31 AM

## 2019-10-25 ENCOUNTER — Encounter: Payer: Self-pay | Admitting: *Deleted

## 2019-10-25 LAB — DIFFERENTIAL
Abs Immature Granulocytes: 0 10*3/uL (ref 0.00–0.07)
Basophils Absolute: 0.1 10*3/uL (ref 0.0–0.1)
Basophils Relative: 1 %
Eosinophils Absolute: 0 10*3/uL (ref 0.0–0.5)
Eosinophils Relative: 0 %
Lymphocytes Relative: 6 %
Lymphs Abs: 0.6 10*3/uL — ABNORMAL LOW (ref 0.7–4.0)
Monocytes Absolute: 0 10*3/uL — ABNORMAL LOW (ref 0.1–1.0)
Monocytes Relative: 0 %
Neutro Abs: 9.1 10*3/uL — ABNORMAL HIGH (ref 1.7–7.7)
Neutrophils Relative %: 93 %
nRBC: 0 /100 WBC

## 2019-10-25 LAB — COMPREHENSIVE METABOLIC PANEL
ALT: 14 U/L (ref 0–44)
AST: 12 U/L — ABNORMAL LOW (ref 15–41)
Albumin: 2.7 g/dL — ABNORMAL LOW (ref 3.5–5.0)
Alkaline Phosphatase: 75 U/L (ref 38–126)
Anion gap: 10 (ref 5–15)
BUN: 20 mg/dL (ref 8–23)
CO2: 31 mmol/L (ref 22–32)
Calcium: 8.4 mg/dL — ABNORMAL LOW (ref 8.9–10.3)
Chloride: 102 mmol/L (ref 98–111)
Creatinine, Ser: 1 mg/dL (ref 0.61–1.24)
GFR calc Af Amer: >60 mL/min (ref >60)
GFR calc non Af Amer: >60 mL/min (ref >60)
Glucose, Bld: 138 mg/dL — ABNORMAL HIGH (ref 70–99)
Potassium: 3.5 mmol/L (ref 3.5–5.1)
Sodium: 143 mmol/L (ref 135–145)
Total Bilirubin: 1.5 mg/dL — ABNORMAL HIGH (ref 0.3–1.2)
Total Protein: 5.5 g/dL — ABNORMAL LOW (ref 6.5–8.1)

## 2019-10-25 LAB — CBC
HCT: 33.5 % — ABNORMAL LOW (ref 39.0–52.0)
Hemoglobin: 11.4 g/dL — ABNORMAL LOW (ref 13.0–17.0)
MCH: 33.6 pg (ref 26.0–34.0)
MCHC: 34 g/dL (ref 30.0–36.0)
MCV: 98.8 fL (ref 80.0–100.0)
Platelets: 150 10*3/uL (ref 150–400)
RBC: 3.39 MIL/uL — ABNORMAL LOW (ref 4.22–5.81)
RDW: 14.3 % (ref 11.5–15.5)
WBC: 9.8 10*3/uL (ref 4.0–10.5)
nRBC: 0 % (ref 0.0–0.2)

## 2019-10-25 LAB — PHOSPHORUS: Phosphorus: 3.1 mg/dL (ref 2.5–4.6)

## 2019-10-25 LAB — GLUCOSE, CAPILLARY
Glucose-Capillary: 125 mg/dL — ABNORMAL HIGH (ref 70–99)
Glucose-Capillary: 128 mg/dL — ABNORMAL HIGH (ref 70–99)
Glucose-Capillary: 133 mg/dL — ABNORMAL HIGH (ref 70–99)
Glucose-Capillary: 134 mg/dL — ABNORMAL HIGH (ref 70–99)

## 2019-10-25 LAB — MAGNESIUM: Magnesium: 2.1 mg/dL (ref 1.7–2.4)

## 2019-10-25 LAB — PREALBUMIN: Prealbumin: 5.9 mg/dL — ABNORMAL LOW (ref 18–38)

## 2019-10-25 LAB — TRIGLYCERIDES: Triglycerides: 53 mg/dL (ref <150)

## 2019-10-25 MED ORDER — TRAVASOL 10 % IV SOLN
INTRAVENOUS | Status: AC
  Filled 2019-10-25: qty 1311

## 2019-10-25 MED ORDER — DEXTROSE-NACL 5-0.45 % IV SOLN: INTRAVENOUS | Status: DC

## 2019-10-25 MED ORDER — ACETAMINOPHEN 10 MG/ML IV SOLN: 1000.0000 mg | Freq: Four times a day (QID) | INTRAVENOUS | Status: DC

## 2019-10-25 MED ORDER — PHENOL 1.4 % MT LIQD
1.0000 | OROMUCOSAL | Status: DC | PRN
  Administered 2019-10-25: 1 via OROMUCOSAL
  Filled 2019-10-25: qty 177

## 2019-10-25 MED ORDER — ACETAMINOPHEN 10 MG/ML IV SOLN
1000.0000 mg | Freq: Four times a day (QID) | INTRAVENOUS | Status: AC
  Administered 2019-10-25 – 2019-10-26 (×4): 1000 mg via INTRAVENOUS
  Filled 2019-10-25 (×4): qty 100

## 2019-10-25 MED ORDER — METHOCARBAMOL 1000 MG/10ML IJ SOLN
500.0000 mg | Freq: Three times a day (TID) | INTRAVENOUS | Status: DC
  Administered 2019-10-25 – 2019-10-26 (×3): 500 mg via INTRAVENOUS
  Filled 2019-10-25 (×2): qty 5
  Filled 2019-10-25: qty 500
  Filled 2019-10-25 (×2): qty 5

## 2019-10-25 MED ORDER — PANTOPRAZOLE SODIUM 40 MG IV SOLR
40.0000 mg | Freq: Two times a day (BID) | INTRAVENOUS | Status: DC
  Administered 2019-10-25 – 2019-10-27 (×5): 40 mg via INTRAVENOUS
  Filled 2019-10-25 (×5): qty 40

## 2019-10-25 MED ORDER — POTASSIUM CHLORIDE 10 MEQ/50ML IV SOLN
10.0000 meq | INTRAVENOUS | Status: AC
  Administered 2019-10-25 (×4): 10 meq via INTRAVENOUS
  Filled 2019-10-25 (×4): qty 50

## 2019-10-25 NOTE — Plan of Care (Signed)

## 2019-10-25 NOTE — Progress Notes (Signed)
Patient ID: Stuart Watts, male   DOB: 1951-08-20, 69 y.o.   MRN: [id number]    3 Days Post-Op  Subjective: Patient seems intermittently confused with the nurses.  He is calling her Dr. Abbe Amsterdam and stating several other things that are odd.  He is ok with me while I'm present.  He tells me he lives with his wife and tells me her name.  He voluntarily tells me today is Monday 2/22.  We discussed an ileus and expectations for improvement after surgery.  Minimal flatus.  Wants to walk more.    ROS: See above, otherwise other systems negative  Objective: Vital signs in last 24 hours: Temp:  [97.9 F (36.6 C)-100.7 F (38.2 C)] 98.2 F (36.8 C) (02/22 0645) Pulse Rate:  [98-107] 98 (02/22 0645) Resp:  [16-20] 16 (02/22 0645) BP: (151-161)/(65-81) 160/71 (02/22 0645) SpO2:  [90 %-100 %] 97 % (02/22 1017) Last BM Date: 10/21/19  Intake/Output from previous day: 02/21 0701 - 02/22 0700 In: 3086.8 [P.O.:80; I.V.:2278.9; IV Piggyback:727.9] Out: 3240 [Urine:1040; Emesis/NG output:2200] Intake/Output this shift: Total I/O In: 120 [P.O.:120] Out: -   PE: Heart: regular Lungs: CTAB Abd: soft, bloated, hypoactive BS, midline incision is c/d/i with staples present.  NGT in place but currently clamped.    Lab Results:  Recent Labs    10/23/19 0244 10/25/19 0438  WBC 9.0 9.8  HGB 12.7* 11.4*  HCT 37.9* 33.5*  PLT 201 150   BMET Recent Labs    10/24/19 0338 10/25/19 0438  NA 145 143  K 3.5 3.5  CL 105 102  CO2 30 31  GLUCOSE 134* 138*  BUN 14 20  CREATININE 0.93 1.00  CALCIUM 8.2* 8.4*   PT/INR No results for input(s): LABPROT, INR in the last 72 hours. CMP     Component Value Date/Time   NA 143 10/25/2019 0438   K 3.5 10/25/2019 0438   CL 102 10/25/2019 0438   CO2 31 10/25/2019 0438   GLUCOSE 138 (H) 10/25/2019 0438   BUN 20 10/25/2019 0438   CREATININE 1.00 10/25/2019 0438   CREATININE 0.89 09/09/2019 0931   CALCIUM 8.4 (L) 10/25/2019 0438   PROT 5.5 (L)  10/25/2019 0438   ALBUMIN 2.7 (L) 10/25/2019 0438   AST 12 (L) 10/25/2019 0438   ALT 14 10/25/2019 0438   ALKPHOS 75 10/25/2019 0438   BILITOT 1.5 (H) 10/25/2019 0438   GFRNONAA >60 10/25/2019 0438   GFRAA >60 10/25/2019 0438   Lipase     Component Value Date/Time   LIPASE 21 10/22/2019 0048       Studies/Results: No results found.  Anti-infectives: Anti-infectives (From admission, onward)   Start     Dose/Rate Route Frequency Ordered Stop   10/22/19 0800  cefoTEtan (CEFOTAN) 2 g in sodium chloride 0.9 % 100 mL IVPB     2 g 200 mL/hr over 30 Minutes Intravenous On call to O.R. 10/22/19 0753 10/23/19 0649       Assessment/Plan POD 3, s/p ex lap with LOA and SB enterotomy repair x1, by AR 2/19 -post op ileus, cont NGT and ice chips -mobilize, patient states some concern about him being steady so will order PT to eval him -cont TNA for PCM -cont IS/pulm toilet -labs stable -patient may have some impulsivity and some confusion at times.  May just be typical post op delirium given his age.  Will watch, but nothing to do at this point.  FEN - NPO/NGT/TNA VTE - Lovenox ID -  none currently   LOS: 3 days    Letha Cape , [facility] 10/25/2019, 11:10 AM Please see Amion for pager number during day hours 7:00am-4:30pm or 7:00am -11:30am on weekends

## 2019-10-25 NOTE — Progress Notes (Signed)
Nutrition Follow-up  DOCUMENTATION CODES:   Not applicable  INTERVENTION:   -TPN management per pharmacy  NUTRITION DIAGNOSIS:   Increased nutrient needs related to post-op healing as evidenced by estimated needs.  Ongoing  GOAL:   Patient will meet greater than or equal to 90% of their needs  Progressing   MONITOR:   Diet advancement, Labs, Weight trends, Skin, I & O's  REASON FOR ASSESSMENT:   Malnutrition Screening Tool    ASSESSMENT:   Stuart Watts is an 69 y.o. male with hx of recurrent SBOs whom was just discharged from the hospital 6 days ago after being admitted for 1 day with small bowel obstruction.  Reportedly, after NG tube placement, he removed at his last admission and began having bowel function.  He was having multiple loose bowel movements.  He was tolerating clears and his diet advanced.  He left [facility] to be treated at [facility] where he was for a day and then discharged.  He reports that on discharge, he was not feeling back to his regular self.  He reports since being home, progressive distention, nausea/vomiting.  He denies any significant abdominal pain.  He does have discomfort related to the distention however.  He reports that he had a small bowel movement and some gas earlier in the day.  2/19- NGT placed; s/p Procedure(s): EXPLORATORY LAPAROTOMY (N/A) Adhesiolysis x 90 minutes Small bowel enterotomy repair x1 2/20- PICC placed, TPN initiated  Reviewed I/O's: -152 ml x 24 hours and -1.4 L since admission  UOP: 1 L x 24 hours  NGT output: 2.2 L x 24 hours  Case discussed with RN, who reports pt has been a little confused and irritable today. She confirmed pt remains with NGT with decompression and TPN was started over the weekend, with plans to advance to goal rate today.   Pt in with MD at time of visit.   Pt currently receiving TPN at 60 ml/hr, which provides 1464 kcals and 83 grams protein, meeting 67% of estimated kcal needs and  69% of estimated protein needs. Per pharmacy notes, plan to increase TPN to 95 ml/hr (goal rate) at 1800, which provides 2300 kcals, and 131 grams protein, meeting 100% of estimated kcal and protein needs.  Medications reviewed and include dextrose 5%-0.45% sodium chloride infusion @ 40 ml/hr.   Labs reviewed: CBGS: 123 (inpatient orders for glycemic control are 0-9 units insulin aspart every 6 hours). K, Mg, and Phos WDL.   Diet Order:   Diet Order            Diet NPO time specified Except for: Ice Chips  Diet effective now              EDUCATION NEEDS:   No education needs have been identified at this time  Skin:  Skin Assessment: Skin Integrity Issues: Skin Integrity Issues:: Incisions Incisions: closed abdomen  Last BM:  10/21/19  Height:   Ht Readings from Last 1 Encounters:  10/22/19 5\' 11"  (1.803 m)    Weight:   Wt Readings from Last 1 Encounters:  10/22/19 59.6 kg    Ideal Body Weight:  78.2 kg  BMI:  Body mass index is 18.33 kg/m.  Estimated Nutritional Needs:   Kcal:  2200-2400  Protein:  120-135 grams  Fluid:  > 2.2 L    10/24/19, RD, LDN, CDCES Registered Dietitian II Certified Diabetes Care and Education Specialist Please refer to [facility] for RD and/or RD on-call/weekend/after hours pager

## 2019-10-25 NOTE — Progress Notes (Signed)
   10/25/19 1103  NG/OG Tube Nasogastric 16 Fr. Right nare Aucultation Measured external length of tube  Placement Date/Time: 10/22/19 0258   Person Inserting Catheter: Mickeal Skinner, RN  Tube Type: Nasogastric  Tube Size (Fr.): 16 Fr.  Tube Location: Right nare  Initial Placement Verification: Aucultation  Technique Used to Measure Tube Placement: Measured ext...  Site Assessment Clean;Dry;Intact  Ongoing Placement Verification No acute changes, not attributed to clinical condition;No change in respiratory status;No change in cm markings or external length of tube from initial placement  Status Suction-low intermittent  Amount of suction 90 mmHg  Drainage Appearance Pink tinged   Barnetta Chapel PA aware. New orders received.

## 2019-10-25 NOTE — Evaluation (Signed)
Physical Therapy Evaluation Patient Details Name: Stuart Watts MRN: [id number] DOB: May 18, 1951 Today's Date: 10/25/2019   History of Present Illness  Patient is a 69 y/o male who presents s/p exploratory laparatomy  with LOA and SB enterotomy repair x1 2/19 with post op ileus. PMH includes recurrent SBOs,  Clinical Impression  Patient presents with dyspnea on exertion, generalized weakness/deconditioning, impaired balance and impaired mobility s/p above. Pt independent PTA and lives with wife. Pt tolerated bed mobility, transfers and gait training with Min guard-Min A for balance/safety. Sp02 dropped to mid 80s on RA and HR up to 127 bpm. Pt impulsive and defensive at times when asked about orientation or questions relating to needing any help at home. Poor safety awareness. Encouraged walking with nursing 3 times per day. Education re: bracing, mobility. Will follow acutely to maximize independence and mobility prior to return home.    Follow Up Recommendations Home health PT;Supervision for mobility/OOB    Equipment Recommendations  None recommended by PT    Recommendations for Other Services       Precautions / Restrictions Precautions Precautions: Fall Precaution Comments: NG to suction, watch Sp02 Restrictions Weight Bearing Restrictions: No      Mobility  Bed Mobility Overal bed mobility: Needs Assistance Bed Mobility: Rolling;Sidelying to Sit Rolling: Supervision Sidelying to sit: Supervision;HOB elevated       General bed mobility comments: Cues for log roll technique, use of rail, increased time.  Transfers Overall transfer level: Needs assistance Equipment used: 1 person hand held assist Transfers: Sit to/from Stand Sit to Stand: Min assist         General transfer comment: ASsist to power to standing, reaching for IV pole for support; declines using RW. "once you depend on it, you always need it." transferred to chair post  ambulation.  Ambulation/Gait Ambulation/Gait assistance: Min assist Gait Distance (Feet): 250 Feet Assistive device: IV Pole;1 person hand held assist Gait Pattern/deviations: Step-through pattern;Decreased stride length Gait velocity: varying speeds- not safe at times.   General Gait Details: Varying speed, trying to speed up, "lets go," after standing rest break. 2/4 DOE. Sp02 in mid 80s on RA with HR 127 bpm. Impulsive. Min A (HHA) for balance and holding IV pole for support.  Stairs            Wheelchair Mobility    Modified Rankin (Stroke Patients Only)       Balance Overall balance assessment: Needs assistance Sitting-balance support: Feet supported;No upper extremity supported Sitting balance-Leahy Scale: Good     Standing balance support: During functional activity Standing balance-Leahy Scale: Fair Standing balance comment: Able to stand statically with close Min guard for short periods, but needs UE support for dynamic standing/walking.                             Pertinent Vitals/Pain Pain Assessment: 0-10 Pain Score: 6 (6.5) Pain Location: abdomen Pain Descriptors / Indicators: Aching;Sore;Operative site guarding Pain Intervention(s): Repositioned;Monitored during session    [place] expects to be discharged to:: Private residence Living Arrangements: Spouse/significant other Available Help at Discharge: Family Type of Home: House Home Access: Level entry     Home Layout: One level Home Equipment: None Additional Comments: Wife reports she will be looking for work so will not be home 24/7.    Prior Function Level of Independence: Independent         Comments: Independent with ADLs, IADLs.  Hand Dominance        Extremity/Trunk Assessment   Upper Extremity Assessment Upper Extremity Assessment: Defer to OT evaluation    Lower Extremity Assessment Lower Extremity Assessment: Generalized weakness(but  functional)       Communication   Communication: No difficulties  Cognition Arousal/Alertness: Awake/alert Behavior During Therapy: Impulsive Overall Cognitive Status: Impaired/Different from baseline Area of Impairment: Safety/judgement                         Safety/Judgement: Decreased awareness of safety;Decreased awareness of deficits     General Comments: "I do not smoke weed or do drugs, I am competent" Tangential with speech at times unrelated to topic. Resistive to orientation questions. Defensive when asked about stairs, "i have no problem with any stairs." Trying to use humor to mask from answering questions. A&Ox3.      General Comments General comments (skin integrity, edema, etc.): HR up to 127 bpm,. Sp02 dropped to mid 80s on RA. Coughing up phelgm.    Exercises     Assessment/Plan    PT Assessment Patient needs continued PT services  PT Problem List Decreased strength;Decreased mobility;Decreased safety awareness;Decreased activity tolerance;Cardiopulmonary status limiting activity;Pain;Decreased balance       PT Treatment Interventions Therapeutic activities;Gait training;Therapeutic exercise;Patient/family education;Balance training;Functional mobility training    PT Goals (Current goals can be found in the Care Plan section)  Acute Rehab PT Goals Patient Stated Goal: to get back to independence PT Goal Formulation: With patient Time For Goal Achievement: 11/08/19 Potential to Achieve Goals: Good    Frequency Min 3X/week   Barriers to discharge Decreased caregiver support      Co-evaluation               AM-PAC PT "6 Clicks" Mobility  Outcome Measure Help needed turning from your back to your side while in a flat bed without using bedrails?: None Help needed moving from lying on your back to sitting on the side of a flat bed without using bedrails?: A Little Help needed moving to and from a bed to a chair (including a wheelchair)?:  A Little Help needed standing up from a chair using your arms (e.g., wheelchair or bedside chair)?: A Little Help needed to walk in hospital room?: A Little Help needed climbing 3-5 steps with a railing? : A Little 6 Click Score: 19    End of Session Equipment Utilized During Treatment: Gait belt Activity Tolerance: Patient tolerated treatment well;Treatment limited secondary to medical complications (Comment)(02) Patient left: in chair;with call bell/phone within reach;with family/visitor present Nurse Communication: Mobility status PT Visit Diagnosis: Pain;Difficulty in walking, not elsewhere classified (R26.2);Unsteadiness on feet (R26.81);Muscle weakness (generalized) (M62.81) Pain - part of body: (abdomen)    Time: 1610-9604 PT Time Calculation (min) (ACUTE ONLY): 28 min   Charges:   PT Evaluation $PT Eval Moderate Complexity: 1 Mod PT Treatments $Gait Training: 8-22 mins        Vale Haven, PT, DPT Acute Rehabilitation Services Pager [phone] Office [phone]      Blake Divine A Lanier Ensign 10/25/2019, 2:50 PM

## 2019-10-25 NOTE — Plan of Care (Signed)
  Problem: Education: Goal: Knowledge of General Education information will improve Description Including pain rating scale, medication(s)/side effects and non-pharmacologic comfort measures Outcome: Progressing   

## 2019-10-25 NOTE — Progress Notes (Signed)
PHARMACY - TOTAL PARENTERAL NUTRITION CONSULT NOTE  Indication: SBO  Patient Measurements: Height: 5\' 11"  (180.3 cm) Weight: 131 lb 6.3 oz (59.6 kg) IBW/kg (Calculated) : 75.3 TPN AdjBW (KG): 61.6 Body mass index is 18.33 kg/m. Usual Weight: ~77 kg  Assessment: [name] with history of multiple SBOs returned on 10/22/19 after a recent admission for SBO.  Patient has progressive abdominal distention, nausea and vomiting since discharge.  He underwent ex-lap with LoA and small bowel enterotomy repair on 2/19.  Patient has severe PCM and Pharmacy consulted to manage TPN.  Per documentation, patient has ongoing weight loss PTA.   Glucose / Insulin: no hx DM - CBGs < 140 Required 3 units SSI  Electrolytes: refeeding - K down to 3.5 (replace), Mag 2.1, Na high normal, Ca low at 8.4, Phos 3.1 Renal: SCr 1, BUN WNL LFTs / TGs: LFTs WNL / tbili slightly elevated at 1.5 / TG WNL Prealbumin / albumin: prealbumin 13.3>5.9, albumin 2.7 Intake / Output; MIVF: UOP 0.7 ml/kg/hr, NG 3/19, D5NS at 40 ml/hr GI Imaging: none since TPN consult Surgeries / Procedures: none since TPN consult  Central access: PICC placed 10/23/19 TPN start date: 10/23/19  Nutritional Goals (per RD rec on 2/19): 2200-2400 kCal, 120-135g protein, > 2.2L fluid per day  Current Nutrition:  TPN  Plan:  Increase TPN to goal rate of 95 ml/hr. TPN will provide 131g AA, 320g CHO and 71g ILE for a total of 2300 kCal, meeting ~100% of patient needs Electrolytes in TPN: reduced Na - other lytes increase with increased TPN rate, Cl:Ac 1:1 Daily trace elements and MWF multivitamin in TPN d/t national shortage KCL 10 meq x 4 Continue sensitive SSI Q6H.  D/C if CBGs remain controlled at goal TPN rate. D/C D51/2NS when new TPN bag starts F/U AM labs  3/19, PharmD, [facility] Clinical Pharmacist Please see AMION for all Pharmacists' Contact Phone Numbers 10/25/2019, 7:29 AM

## 2019-10-25 NOTE — Telephone Encounter (Signed)
Thanks for letting me know!

## 2019-10-26 LAB — GLUCOSE, CAPILLARY
Glucose-Capillary: 131 mg/dL — ABNORMAL HIGH (ref 70–99)
Glucose-Capillary: 115 mg/dL — ABNORMAL HIGH (ref 70–99)
Glucose-Capillary: 78 mg/dL (ref 70–99)

## 2019-10-26 LAB — BASIC METABOLIC PANEL
Anion gap: 9 (ref 5–15)
BUN: 22 mg/dL (ref 8–23)
CO2: 28 mmol/L (ref 22–32)
Calcium: 8.6 mg/dL — ABNORMAL LOW (ref 8.9–10.3)
Chloride: 106 mmol/L (ref 98–111)
Creatinine, Ser: 0.9 mg/dL (ref 0.61–1.24)
GFR calc Af Amer: >60 mL/min (ref >60)
GFR calc non Af Amer: >60 mL/min (ref >60)
Glucose, Bld: 119 mg/dL — ABNORMAL HIGH (ref 70–99)
Potassium: 3.8 mmol/L (ref 3.5–5.1)
Sodium: 143 mmol/L (ref 135–145)

## 2019-10-26 LAB — MAGNESIUM: Magnesium: 1.8 mg/dL (ref 1.7–2.4)

## 2019-10-26 LAB — PHOSPHORUS: Phosphorus: 3.3 mg/dL (ref 2.5–4.6)

## 2019-10-26 MED ORDER — TRAVASOL 10 % IV SOLN
INTRAVENOUS | Status: DC
  Filled 2019-10-26: qty 1311

## 2019-10-26 MED ORDER — OXYCODONE HCL 5 MG PO TABS
5.0000 mg | ORAL_TABLET | ORAL | Status: DC | PRN
  Administered 2019-10-26 – 2019-10-28 (×2): 5 mg via ORAL
  Filled 2019-10-26 (×2): qty 1
  Filled 2019-10-26: qty 2
  Filled 2019-10-26: qty 1

## 2019-10-26 MED ORDER — METHOCARBAMOL 500 MG PO TABS
500.0000 mg | ORAL_TABLET | Freq: Three times a day (TID) | ORAL | Status: DC
  Administered 2019-10-26 – 2019-10-28 (×4): 500 mg via ORAL
  Filled 2019-10-26 (×7): qty 1

## 2019-10-26 MED ORDER — MAGNESIUM SULFATE 2 GM/50ML IV SOLN
2.0000 g | Freq: Once | INTRAVENOUS | Status: AC
  Administered 2019-10-26: 2 g via INTRAVENOUS
  Filled 2019-10-26: qty 50

## 2019-10-26 MED ORDER — POTASSIUM CHLORIDE 10 MEQ/50ML IV SOLN
10.0000 meq | INTRAVENOUS | Status: AC
  Administered 2019-10-26 (×2): 10 meq via INTRAVENOUS
  Filled 2019-10-26 (×2): qty 50

## 2019-10-26 NOTE — Plan of Care (Signed)

## 2019-10-26 NOTE — Progress Notes (Signed)
[facility] Progress Note  4 Days Post-Op  Subjective: Patient reports he has been walking and pain is well controlled. He reports that he is passing flatus and tells me he had a BM. 2 BM documented from overnight. He denies nausea.    Objective: Vital signs in last 24 hours: Temp:  [98.3 F (36.8 C)-99.1 F (37.3 C)] 98.3 F (36.8 C) (02/23 0540) Pulse Rate:  [96-99] 99 (02/23 0540) Resp:  [15-22] 17 (02/23 0540) BP: (159-161)/(72-81) 159/81 (02/23 0540) SpO2:  [95 %-100 %] 100 % (02/23 0540) Last BM Date: 10/26/19  Intake/Output from previous day: 02/22 0701 - 02/23 0700 In: 2643 [P.O.:360; I.V.:1977.9; IV Piggyback:305] Out: 2400 [Urine:150; Emesis/NG output:2250] Intake/Output this shift: Total I/O In: 60 [P.O.:60] Out: 450 [Urine:200; Emesis/NG output:250]  PE: Heart: regular Lungs: CTAB Abd: soft, bloated, hypoactive BS, midline incision is c/d/i with staples present.  NGT in place with bilious drainage    Lab Results:  Recent Labs    10/25/19 0438  WBC 9.8  HGB 11.4*  HCT 33.5*  PLT 150   BMET Recent Labs    10/25/19 0438 10/26/19 0356  NA 143 143  K 3.5 3.8  CL 102 106  CO2 31 28  GLUCOSE 138* 119*  BUN 20 22  CREATININE 1.00 0.90  CALCIUM 8.4* 8.6*   PT/INR No results for input(s): LABPROT, INR in the last 72 hours. CMP     Component Value Date/Time   NA 143 10/26/2019 0356   K 3.8 10/26/2019 0356   CL 106 10/26/2019 0356   CO2 28 10/26/2019 0356   GLUCOSE 119 (H) 10/26/2019 0356   BUN 22 10/26/2019 0356   CREATININE 0.90 10/26/2019 0356   CREATININE 0.89 09/09/2019 0931   CALCIUM 8.6 (L) 10/26/2019 0356   PROT 5.5 (L) 10/25/2019 0438   ALBUMIN 2.7 (L) 10/25/2019 0438   AST 12 (L) 10/25/2019 0438   ALT 14 10/25/2019 0438   ALKPHOS 75 10/25/2019 0438   BILITOT 1.5 (H) 10/25/2019 0438   GFRNONAA >60 10/26/2019 0356   GFRAA >60 10/26/2019 0356   Lipase     Component Value Date/Time   LIPASE 21 10/22/2019 0048        Studies/Results: No results found.  Anti-infectives: Anti-infectives (From admission, onward)   Start     Dose/Rate Route Frequency Ordered Stop   10/22/19 0800  cefoTEtan (CEFOTAN) 2 g in sodium chloride 0.9 % 100 mL IVPB     2 g 200 mL/hr over 30 Minutes Intravenous On call to O.R. 10/22/19 0753 10/23/19 0649       Assessment/Plan POD 4, s/p ex lap with LOA and SB enterotomy repair x1, by AR 2/19 -post op ileus improving and patient having bowel function - clamp NGT and give clears if tolerating clears then d/c NGT this afternoon  -mobilizing well  -cont TNA for PCM -cont IS/pulm toilet -labs stable -patient may have some impulsivity and some confusion at times.  May just be typical post op delirium given his age.  Will watch, but nothing to do at this point.  FEN - clears/clamp NGT/TNA VTE - Lovenox  ID - none currently  LOS: 4 days    Wells Guiles , [facility] Surgery 10/26/2019, 11:52 AM Please see Amion for pager number during day hours 7:00am-4:30pm

## 2019-10-26 NOTE — Anesthesia Postprocedure Evaluation (Signed)
Anesthesia Post Note  Patient: Stuart Watts  Procedure(s) Performed: EXPLORATORY LAPAROTOMY (N/A Abdomen) LYSIS OF ADHESIONS  (Abdomen)     Patient location during evaluation: PACU Anesthesia Type: General Level of consciousness: awake and alert Pain management: pain level controlled Vital Signs Assessment: post-procedure vital signs reviewed and stable Respiratory status: spontaneous breathing, nonlabored ventilation, respiratory function stable and patient connected to nasal cannula oxygen Cardiovascular status: blood pressure returned to baseline and stable Postop Assessment: no apparent nausea or vomiting Anesthetic complications: no    Last Vitals:  Vitals:   10/25/19 2022 10/26/19 0540  BP: (!) 161/72 (!) 159/81  Pulse: 96 99  Resp: 15 17  Temp: 36.9 C 36.8 C  SpO2: 95% 100%    Last Pain:  Vitals:   10/26/19 0836  TempSrc:   PainSc: 0-No pain                 Aleigha Gilani S

## 2019-10-26 NOTE — Progress Notes (Signed)
PHARMACY - TOTAL PARENTERAL NUTRITION CONSULT NOTE  Indication: SBO  Patient Measurements: Height: 5\' 11"  (180.3 cm) Weight: 131 lb 6.3 oz (59.6 kg) IBW/kg (Calculated) : 75.3 TPN AdjBW (KG): 61.6 Body mass index is 18.33 kg/m. Usual Weight: ~77 kg  Assessment: 84 YOM with history of multiple SBOs returned on 10/22/19 after a recent admission for SBO.  Patient has progressive abdominal distention, nausea and vomiting since discharge.  He underwent ex-lap with LoA and small bowel enterotomy repair on 2/19.  Patient has severe PCM and Pharmacy consulted to manage TPN.  Per documentation, patient has ongoing weight loss PTA.   Glucose / Insulin: no hx DM - CBGs < 135 Required 4 units SSI  Electrolytes: K down to 3.8, Mag 1.8, Na high normal, Ca low at 8.6, Phos 3.3 Renal: SCr 0.9, BUN WNL LFTs / TGs: LFTs WNL / tbili slightly elevated at 1.5 / TG WNL Prealbumin / albumin: prealbumin 13.3>5.9, albumin 2.7 Intake / Output; MIVF: UOP 2 x unmeasured, NG 2850 mL, BM x2 GI Imaging: none since TPN consult Surgeries / Procedures: none since TPN consult  Central access: PICC placed 10/23/19 TPN start date: 10/23/19  Nutritional Goals (per RD rec on 2/19): 2200-2400 kCal, 120-135g protein, > 2.2L fluid per day  Current Nutrition:  TPN  Plan:  Increase TPN to goal rate of 95 ml/hr. TPN will provide 131g AA, 320g CHO and 71g ILE for a total of 2300 kCal, meeting ~100% of patient needs Electrolytes in TPN: continue reduced Na - incr Mag, continue other lytes, Cl:Ac 1:1 Daily trace elements and MWF multivitamin in TPN d/t national shortage KCL 10 meq x 2 Mag 2 gms x 1 D/C CBGs and SSI as blood glucose remains controlled at goal TPN rate. F/U AM labs  3/19, PharmD, [facility] Clinical Pharmacist Please see AMION for all Pharmacists' Contact Phone Numbers 10/26/2019, 7:12 AM

## 2019-10-27 LAB — BASIC METABOLIC PANEL
Anion gap: 8 (ref 5–15)
BUN: 23 mg/dL (ref 8–23)
CO2: 25 mmol/L (ref 22–32)
Calcium: 8.6 mg/dL — ABNORMAL LOW (ref 8.9–10.3)
Chloride: 109 mmol/L (ref 98–111)
Creatinine, Ser: 0.85 mg/dL (ref 0.61–1.24)
GFR calc Af Amer: >60 mL/min (ref >60)
GFR calc non Af Amer: >60 mL/min (ref >60)
Glucose, Bld: 122 mg/dL — ABNORMAL HIGH (ref 70–99)
Potassium: 4.2 mmol/L (ref 3.5–5.1)
Sodium: 142 mmol/L (ref 135–145)

## 2019-10-27 LAB — PHOSPHORUS: Phosphorus: 3.3 mg/dL (ref 2.5–4.6)

## 2019-10-27 LAB — MAGNESIUM: Magnesium: 1.9 mg/dL (ref 1.7–2.4)

## 2019-10-27 MED ORDER — HYDROMORPHONE HCL 1 MG/ML IJ SOLN
1.0000 mg | INTRAMUSCULAR | Status: DC | PRN
  Administered 2019-10-27 – 2019-10-28 (×2): 1 mg via INTRAVENOUS
  Filled 2019-10-27 (×2): qty 1

## 2019-10-27 MED ORDER — FAMOTIDINE 20 MG PO TABS
20.0000 mg | ORAL_TABLET | Freq: Every day | ORAL | Status: DC
  Filled 2019-10-27: qty 1

## 2019-10-27 MED ORDER — TRAVASOL 10 % IV SOLN
INTRAVENOUS | Status: AC
  Filled 2019-10-27: qty 690

## 2019-10-27 MED ORDER — HYDRALAZINE HCL 20 MG/ML IJ SOLN
10.0000 mg | Freq: Four times a day (QID) | INTRAMUSCULAR | Status: DC | PRN
  Administered 2019-10-27: 10 mg via INTRAVENOUS
  Filled 2019-10-27: qty 1

## 2019-10-27 MED ORDER — ENSURE ENLIVE PO LIQD
237.0000 mL | Freq: Three times a day (TID) | ORAL | Status: DC
  Administered 2019-10-27: 237 mL via ORAL

## 2019-10-27 MED ORDER — ACETAMINOPHEN 500 MG PO TABS
1000.0000 mg | ORAL_TABLET | Freq: Three times a day (TID) | ORAL | Status: DC
  Administered 2019-10-27 – 2019-10-28 (×3): 1000 mg via ORAL
  Filled 2019-10-27 (×3): qty 2

## 2019-10-27 NOTE — Progress Notes (Signed)
PHARMACY - TOTAL PARENTERAL NUTRITION CONSULT NOTE  Indication: SBO  Patient Measurements: Height: 5\' 11"  (180.3 cm) Weight: 131 lb 6.3 oz (59.6 kg) IBW/kg (Calculated) : 75.3 TPN AdjBW (KG): 61.6 Body mass index is 18.33 kg/m. Usual Weight: ~77 kg  Assessment: 41 YOM with history of multiple SBOs returned on 10/22/19 after a recent admission for SBO.  Patient has progressive abdominal distention, nausea and vomiting since discharge.  He underwent ex-lap with LoA and small bowel enterotomy repair on 2/19.  Patient has severe PCM and Pharmacy consulted to manage TPN.  Per documentation, patient has ongoing weight loss PTA.   Glucose / Insulin: no hx DM - CBGs < 125 SSI d/c'd Electrolytes: K 4.2, Mag 1.9, Na high normal, Ca low at 8.6, Phos 3.3 Renal: SCr 0.85, BUN WNL LFTs / TGs: LFTs WNL / tbili slightly elevated at 1.5 / TG WNL Prealbumin / albumin: prealbumin 13.3>5.9, albumin 2.7 Intake / Output; MIVF: UOP 2 x unmeasured, NG 2850 mL, BM x2 GI Imaging: none since TPN consult Surgeries / Procedures: none since TPN consult  Central access: PICC placed 10/23/19 TPN start date: 10/23/19  Nutritional Goals (per RD rec on 2/19): 2200-2400 kCal, 120-135g protein, > 2.2L fluid per day  Current Nutrition:  TPN Per Surgery PA - decrease TPN to half rate  Plan:  Decrease TPN to half rate of 50 ml/hr. TPN will provide 69g AA, 168g CHO and 37g ILE for a total of 1210 kCal, meeting ~50% of patient needs Electrolytes in TPN: continue reduced Na - incr Mag, continue other lytes, Cl:Ac 1:1 Daily trace elements and MWF multivitamin in TPN d/t national shortage F/U AM labs  3/19, PharmD, [facility] Clinical Pharmacist Please see AMION for all Pharmacists' Contact Phone Numbers 10/27/2019, 9:51 AM

## 2019-10-27 NOTE — Progress Notes (Signed)
Nutrition Follow-up  DOCUMENTATION CODES:   Underweight, Severe malnutrition in context of chronic illness  INTERVENTION:   -TPN management per pharmacy -Ensure Enlive po TID, each supplement provides 350 kcal and 20 grams of protein -Magic cup TID with meals, each supplement provides 290 kcal and 9 grams of protein -Provided education on low fiber diet; "Low Fiber Nutrition Therapy" handout attached to AVS/ discharge summary  NUTRITION DIAGNOSIS:   Severe Malnutrition related to chronic illness(chronic illness) as evidenced by severe muscle depletion, severe fat depletion, percent weight loss.  Ongoing  GOAL:   Patient will meet greater than or equal to 90% of their needs  Progressing   MONITOR:   PO intake, Supplement acceptance, Diet advancement, Labs, Weight trends, Skin, I & O's  REASON FOR ASSESSMENT:   Malnutrition Screening Tool    ASSESSMENT:   Stuart Watts is an 69 y.o. male with hx of recurrent SBOs whom was just discharged from the hospital 6 days ago after being admitted for 1 day with small bowel obstruction.  Reportedly, after NG tube placement, he removed at his last admission and began having bowel function.  He was having multiple loose bowel movements.  He was tolerating clears and his diet advanced.  He left [facility] to be treated at [facility] where he was for a day and then discharged.  He reports that on discharge, he was not feeling back to his regular self.  He reports since being home, progressive distention, nausea/vomiting.  He denies any significant abdominal pain.  He does have discomfort related to the distention however.  He reports that he had a small bowel movement and some gas earlier in the day.  2/19- NGT placed; s/pProcedure(s): EXPLORATORY LAPAROTOMY (N/A) Adhesiolysis x 90 minutes Small bowel enterotomy repair x1 2/20- PICC placed, TPN initiated 2/23- NGT clamped, advanced to clear liquids 2/24- NGT removed, advanced to full  liquids  Reviewed I/O's: +1.6 L x 24 hours and +465ml x 24 hours  UOP: 200 ml x 24 hours  NGT output: 250 ml x 24 hours  Spoke with pt at bedside, who remembered this RD from previous admission.Wife also present in the room. Pt reports that his wife has been assisting him with his diet over the past 2 weeks and has been cosuming Ensure at home. He is eager to go home, but is happy he ended up getting surgery ("my wife and Dr. Laural Golden set me straight').   Pt reports ongoing weight loss since previous surgery 6 months ago. He reports "I don't feel like I every recovered from that surgery". Reviewed wt hx; noted pt has experienced 2 5.2% wt loss over the past month, which is significant for time frame.   Pt is eager to eat and expresses importance of nutrition to assist with post-operative healing. He does not like a lot of foods on the full liquid diet, but was able to pick out some items for lunch. He is also amenable to Ensure. He is eager to eat solid food; discussed diet progression and educated pt and wife on soft diet.   Pt receiving TPN at 95 ml/hr, which provides 2300 kcals and 131 grams protein, meeting 100% of estimated kcal and protein needs. Per pharmacy notes, plan to  Decrease TPN to half rate of 50 ml/hr, which provides 1210 kcals and 69 grams protein, meeting 55% of estimated kcal needs and 58% of estimated protein needs.  Labs reviewed: CBGS: 78-131.   NUTRITION - FOCUSED PHYSICAL EXAM:  Most Recent Value  Orbital Region  Severe depletion  Upper Arm Region  Severe depletion  Thoracic and Lumbar Region  Severe depletion  Buccal Region  Severe depletion  Temple Region  Severe depletion  Clavicle Bone Region  Severe depletion  Clavicle and Acromion Bone Region  Severe depletion  Scapular Bone Region  Severe depletion  Dorsal Hand  Severe depletion  Patellar Region  Severe depletion  Anterior Thigh Region  Severe depletion  Posterior Calf Region  Severe depletion  Edema  (RD Assessment)  None  Hair  Reviewed  Eyes  Reviewed  Mouth  Reviewed  Skin  Reviewed  Nails  Reviewed       Diet Order:   Diet Order            Diet full liquid Room service appropriate? Yes; Fluid consistency: Thin  Diet effective now              EDUCATION NEEDS:   Education needs have been addressed  Skin:  Skin Assessment: Skin Integrity Issues: Skin Integrity Issues:: Incisions Incisions: closed abdomen  Last BM:  10/27/19  Height:   Ht Readings from Last 1 Encounters:  10/22/19 5\' 11"  (1.803 m)    Weight:   Wt Readings from Last 1 Encounters:  10/22/19 59.6 kg    Ideal Body Weight:  78.2 kg  BMI:  Body mass index is 18.33 kg/m.  Estimated Nutritional Needs:   Kcal:  2200-2400  Protein:  120-135 grams  Fluid:  > 2.2 L    10/24/19, RD, LDN, CDCES Registered Dietitian II Certified Diabetes Care and Education Specialist Please refer to [facility] for RD and/or RD on-call/weekend/after hours pager

## 2019-10-27 NOTE — Progress Notes (Signed)
PT Cancellation Note  Patient Details Name: Stuart Watts MRN: [id number] DOB: 1951/02/19   Cancelled Treatment:    Reason Eval/Treat Not Completed: Fatigue/lethargy limiting ability to participate(pt stated he's very tired and would like to nap. He stated he walked a long distance in the hall earlier today. He would like PT to attempt at a later time. Will follow.)   Tamala Ser PT 10/27/2019  Acute Rehabilitation Services Pager [phone] Office [phone]

## 2019-10-27 NOTE — Discharge Summary (Signed)
    Patient ID: Stuart Watts [id number] 25-Aug-1951 69 y.o.  Admit date: 10/14/2019 Discharge date: 10/16/2019  Admitting Diagnosis: SBO  Discharge Diagnosis Patient Active Problem List   Diagnosis Date Noted  . 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 Protein-calorie malnutrition, moderate (HCC) 10/16/2019  . Noncompliance with NG tube 10/16/2019  . History of small bowel obstruction 10/16/2019  . SBO (small bowel obstruction) (HCC) 10/15/2019    Consultants none  Reason for Admission: Patient has a history of previous small bowel obstructions.  He initially had surgery for small bowel obstruction in 2007.  He had another operation for small bowel obstruction in 2020.  That was done in [place].  Since that time, he has had several episodes of bowel obstructions which have resolved with medical therapy.  The most recent time he was admitted to [facility] on January 1 of this year.  His obstruction resolved with medical therapy.  He presented to the emergency department today with a 2-day history of obstipation and abdominal distention.  CT scan revealed small bowel obstruction and I was asked to see him for admission.  Procedures none  Hospital Course:  The patient was admitted and placed on the SBO protocol.  He quickly resolved and his diet was advanced to soft as tolerated.  He was stable for DC home on HD 2.    I have not seen this patient and information is taken from the chart.  Physical Exam: See progress note from day of discharge  Allergies as of 10/16/2019   No Known Allergies     Medication List    TAKE these medications   Align 4 MG Caps Take 4 mg by mouth daily.          Signed: Barnetta Chapel, [facility] Surgery 10/27/2019, 3:01 PM Please see Amion for pager number during day hours 7:00am-4:30pm, 7-11:30am on Weekends

## 2019-10-27 NOTE — Progress Notes (Signed)
5 Days Post-Op    CC: Abdominal  distention, nausea and vomiting  Subjective: He says he feels better this morning but is mostly because of the NG irritation.  He says he is passing gas he had multiple bowel movements, and he would like some pancakes, he also wants to go to [place].  Midline incision looks fine abdomen is soft positive bowel sounds positive BM.  Objective: Vital signs in last 24 hours: Temp:  [97.6 F (36.4 C)-98.6 F (37 C)] 98.5 F (36.9 C) (02/24 0354) Pulse Rate:  [85-88] 85 (02/24 0354) Resp:  [16-18] 17 (02/24 0354) BP: (151-173)/(67-81) 151/81 (02/24 0354) SpO2:  [96 %-99 %] 99 % (02/24 0354) Last BM Date: 10/27/19 720 p.o. 2052 IV 200 urine recorded NG 250 Afebrile vital signs are stable K+ 4.2 No imaging Intake/Output from previous day: 02/23 0701 - 02/24 0700 In: 2052.1 [P.O.:720; I.V.:1332.1] Out: 450 [Urine:200; Emesis/NG output:250] Intake/Output this shift: No intake/output data recorded.  General appearance: alert, cooperative and no distress Resp: clear to auscultation bilaterally GI: Soft, sore, midline incision looks good.  Positive bowel sounds, positive BM.  Lab Results:  Recent Labs    10/25/19 0438  WBC 9.8  HGB 11.4*  HCT 33.5*  PLT 150    BMET Recent Labs    10/26/19 0356 10/27/19 0413  NA 143 142  K 3.8 4.2  CL 106 109  CO2 28 25  GLUCOSE 119* 122*  BUN 22 23  CREATININE 0.90 0.85  CALCIUM 8.6* 8.6*   PT/INR No results for input(s): LABPROT, INR in the last 72 hours.  Recent Labs  Lab 10/22/19 0048 10/25/19 0438  AST 15 12*  ALT 18 14  ALKPHOS 76 75  BILITOT 1.0 1.5*  PROT 7.6 5.5*  ALBUMIN 4.3 2.7*     Lipase     Component Value Date/Time   LIPASE 21 10/22/2019 0048   Prior to Admission medications   Medication Sig Start Date End Date Taking? Authorizing Provider  metroNIDAZOLE (FLAGYL) 250 MG tablet Take 1 tablet (250 mg total) by mouth 3 (three) times daily. Patient taking  differently: Take 250 mg by mouth 3 (three) times daily. FOR 10 DAYS 10/19/19  Yes Rehman, Joline Maxcy, MD  Probiotic Product (ALIGN) 4 MG CAPS Take 4 mg by mouth daily.   Yes [provider]  Simethicone (PHAZYME ULTRA STRENGTH) 180 MG CAPS Take 1 capsule (180 mg total) by mouth 3 (three) times daily as needed. Patient taking differently: Take 180 mg by mouth 3 (three) times daily as needed (for gas or heartburn).  10/19/19  Yes Rehman, Joline Maxcy, MD  traMADol (ULTRAM) 50 MG tablet Take 1 tablet (50 mg total) by mouth 3 (three) times daily as needed. Patient taking differently: Take 50 mg by mouth 3 (three) times daily as needed (for pain).  10/19/19  Yes Rehman, Joline Maxcy, MD      Medications: . Chlorhexidine Gluconate Cloth  6 each Topical Daily  . enoxaparin (LOVENOX) injection  40 mg Subcutaneous Q24H  . methocarbamol  500 mg Oral TID  . pantoprazole (PROTONIX) IV  40 mg Intravenous BID  . 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 May just be typical post op delirium given his age. Will watch, but nothing to do at this point.  FEN -TPN/clear liquids/NG VTE -Lovenox  ID -none currently Pain: IV Tylenol x2, IV Robaxin x1, Protonix 40 mg IV x2 oxycodone 5 mg x 1   Pain: NG tube was removed.  Advance to full liquids, decrease TPN to half strength.  Continue to mobilize.  If he does well with full liquids, advance to a soft diet in the a.m. and home later Thursday, or Friday AM.  We will get him off all  the IV medications today.    LOS: 5 days    Tyshawn Ciullo 10/27/2019 Please see Amion

## 2019-10-27 NOTE — Progress Notes (Signed)
Physical Therapy Treatment Patient Details Name: Stuart Watts MRN: [id number] DOB: 03-18-1951 Today's Date: 10/27/2019    History of Present Illness Patient is a 69 y/o male who presents s/p exploratory laparatomy  with LOA and SB enterotomy repair x1 2/19 with post op ileus. PMH includes recurrent SBOs,    PT Comments    Pt ambulated 500' without an assistive device, no loss of balance, no dyspnea. Encouraged pt to ambulate in halls independently TID. No further acute PT indicated as pt is independent with mobility. PT goals met.  Will sign off.   Follow Up Recommendations  No PT follow up     Equipment Recommendations  None recommended by PT    Recommendations for Other Services       Precautions / Restrictions Precautions Precautions: Other (comment) Precaution Comments: s/p abdominal surgery Restrictions Weight Bearing Restrictions: No    Mobility  Bed Mobility Overal bed mobility: Needs Assistance Bed Mobility: Rolling;Sidelying to Sit;Sit to Supine Rolling: Modified independent (Device/Increase time) Sidelying to sit: Modified independent (Device/Increase time)   Sit to supine: Modified independent (Device/Increase time)   General bed mobility comments: used rail  Transfers Overall transfer level: Needs assistance Equipment used: None Transfers: Sit to/from Stand Sit to Stand: Independent            Ambulation/Gait Ambulation/Gait assistance: Independent Gait Distance (Feet): 500 Feet Assistive device: None Gait Pattern/deviations: WFL(Within Functional Limits) Gait velocity: WNL   General Gait Details: steady, no loss of balance, no dyspnea   Stairs             Wheelchair Mobility    Modified Rankin (Stroke Patients Only)       Balance Overall balance assessment: No apparent balance deficits (not formally assessed)                                          Cognition Arousal/Alertness: Awake/alert Behavior  During Therapy: WFL for tasks assessed/performed Overall Cognitive Status: Within Functional Limits for tasks assessed                                 General Comments: speech somewhat tangential at times      Exercises      General Comments        Pertinent Vitals/Pain Pain Score: 4  Pain Location: abdomen Pain Descriptors / Indicators: Sore Pain Intervention(s): Limited activity within patient's tolerance;Monitored during session;Premedicated before session    Home Living                      Prior Function            PT Goals (current goals can now be found in the care plan section) Acute Rehab PT Goals Patient Stated Goal: to get back to independence PT Goal Formulation: With patient Time For Goal Achievement: 11/08/19 Potential to Achieve Goals: Good Progress towards PT goals: Goals met/education completed, patient discharged from PT    Frequency    Min 3X/week      PT Plan Current plan remains appropriate    Co-evaluation              AM-PAC PT "6 Clicks" Mobility   Outcome Measure  Help needed turning from your back to your side while in a flat bed without using bedrails?: None  Help needed moving from lying on your back to sitting on the side of a flat bed without using bedrails?: None Help needed moving to and from a bed to a chair (including a wheelchair)?: None Help needed standing up from a chair using your arms (e.g., wheelchair or bedside chair)?: None Help needed to walk in hospital room?: None Help needed climbing 3-5 steps with a railing? : None 6 Click Score: 24    End of Session   Activity Tolerance: Patient tolerated treatment well Patient left: with call bell/phone within reach;in bed Nurse Communication: Mobility status PT Visit Diagnosis: Pain;Difficulty in walking, not elsewhere classified (R26.2);Unsteadiness on feet (R26.81);Muscle weakness (generalized) (M62.81) Pain - part of body: (abdomen)      Time: 5537-4827 PT Time Calculation (min) (ACUTE ONLY): 14 min  Charges:  $Gait Training: 8-22 mins                     Blondell Reveal Kistler PT 10/27/2019  Acute Rehabilitation Services Pager [phone] Office [phone]

## 2019-10-28 DIAGNOSIS — E43 Unspecified severe protein-calorie malnutrition: Secondary | ICD-10-CM | POA: Insufficient documentation

## 2019-10-28 LAB — COMPREHENSIVE METABOLIC PANEL
ALT: 16 U/L (ref 0–44)
AST: 14 U/L — ABNORMAL LOW (ref 15–41)
Albumin: 3.1 g/dL — ABNORMAL LOW (ref 3.5–5.0)
Alkaline Phosphatase: 73 U/L (ref 38–126)
Anion gap: 11 (ref 5–15)
BUN: 22 mg/dL (ref 8–23)
CO2: 26 mmol/L (ref 22–32)
Calcium: 9 mg/dL (ref 8.9–10.3)
Chloride: 105 mmol/L (ref 98–111)
Creatinine, Ser: 0.78 mg/dL (ref 0.61–1.24)
GFR calc Af Amer: >60 mL/min (ref >60)
GFR calc non Af Amer: >60 mL/min (ref >60)
Glucose, Bld: 126 mg/dL — ABNORMAL HIGH (ref 70–99)
Potassium: 4.5 mmol/L (ref 3.5–5.1)
Sodium: 142 mmol/L (ref 135–145)
Total Bilirubin: 1 mg/dL (ref 0.3–1.2)
Total Protein: 6.3 g/dL — ABNORMAL LOW (ref 6.5–8.1)

## 2019-10-28 LAB — PHOSPHORUS: Phosphorus: 3.6 mg/dL (ref 2.5–4.6)

## 2019-10-28 LAB — MAGNESIUM: Magnesium: 1.8 mg/dL (ref 1.7–2.4)

## 2019-10-28 MED ORDER — ACETAMINOPHEN 500 MG PO TABS: ORAL_TABLET | ORAL | 0 refills | Status: DC

## 2019-10-28 MED ORDER — OXYCODONE HCL 5 MG PO TABS: ORAL_TABLET | ORAL | 0 refills | Status: DC

## 2019-10-28 NOTE — Discharge Summary (Signed)
Physician Discharge Summary  Patient ID: Stuart Watts MRN: [id number] DOB/AGE: 03-18-1951 69 y.o.  Admit date: 10/22/2019 Discharge date: 10/28/2019  Admission Diagnoses:  Recurrent small bowel obstruction Hx small bowel resection 2007, 2017 Hx lysis of adhesions 2007, 2020  Discharge Diagnoses:  Recurrent small bowel obstruction Hx small bowel resection 2007, 2017 Hx lysis of adhesions 2007, 2020 Protein calorie malnutrition -severe Active Problems:   SBO (small bowel obstruction) (HCC)   Protein-calorie malnutrition, severe   PROCEDURES: Exploratory laparotomy, lysis of adhesions took, small bowel enterotomy repair x1 Dr. Axel Filler 10/22/2019  Hospital Course:  Stuart Watts is an 69 y.o. male with hx of recurrent SBOs whom was just discharged from the hospital 6 days ago after being admitted for 1 day with small bowel obstruction.  Reportedly, after NG tube placement, he removed at his last admission and began having bowel function.  He was having multiple loose bowel movements.  He was tolerating clears and his diet advanced.  He left [facility] to be treated at [facility] where he was for a day and then discharged.  He reports that on discharge, he was not feeling back to his regular self.  He reports since being home, progressive distention, nausea/vomiting.  He denies any significant abdominal pain.  He does have discomfort related to the distention however.  He reports that he had a small bowel movement and some gas earlier in the day. He saw Dr. Karilyn Cota 2/16 whom noted steady improvement but since, things have recurred. 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  Patient was seen by Dr. Janee Morn and admitted from the ER.  He was seen the following a.m. by Dr. Derrell Lolling.  It was his opinion patient go ahead with  surgery.  He was taken the OR later that a.m. underwent procedure above.  Postop he had an ileus and was placed on TNA for severe protein calorie malnutrition.  As his bowel function improved his diet was advanced he was ready for discharge in the early afternoon 10/28/2019; follow-up as listed below..   Condition on DC: Improved  CBC Latest Ref Rng & Units 10/25/2019 10/23/2019 10/22/2019  WBC 4.0 - 10.5 K/uL 9.8 9.0 10.1  Hemoglobin 13.0 - 17.0 g/dL 11.4(L) 12.7(L) 15.3  Hematocrit 39.0 - 52.0 % 33.5(L) 37.9(L) 45.1  Platelets 150 - 400 K/uL 150 201 224   CMP Latest Ref Rng & Units 10/28/2019 10/27/2019 10/26/2019  Glucose 70 - 99 mg/dL 433(I) 951(O) 841(Y)  BUN 8 - 23 mg/dL 22 23 22   Creatinine 0.61 - 1.24 mg/dL 6.06 3.01  Sodium 135 - 145 mmol/L 142 142 143  Potassium 3.5 - 5.1 mmol/L 4.5 4.2 3.8  Chloride 98 - 111 mmol/L 105 109 106  CO2 22 - 32 mmol/L 26 25 28   Calcium 8.9 - 10.3 mg/dL 9.0 6.01) )  Total Protein 6.5 - 8.1 g/dL 6.3(L) - -  Total Bilirubin 0.3 - 1.2 mg/dL 1.0 - -  Alkaline Phos 38 - 126 U/L 73 - -  AST 15 - 41 U/L 14(L) - -  ALT 0 - 44 U/L 16 - -     Disposition: Discharge disposition: 01-Home or Self Care        Allergies as of 10/28/2019   No Known Allergies     Medication List    STOP  taking these medications   metroNIDAZOLE 250 MG tablet Commonly known as: FLAGYL   Simethicone 180 MG Caps Commonly known as: Phazyme Ultra Strength   traMADol 50 MG tablet Commonly known as: ULTRAM     TAKE these medications   acetaminophen 500 MG tablet Commonly known as: TYLENOL You can take 1000 mg of plain Tylenol every 6 hours as needed for pain.  You can buy this over-the-counter at any drugstore.  Do not take more than 4000 mg of Tylenol per day it can harm your liver.   Align 4 MG Caps Take 4 mg by mouth daily.   oxyCODONE 5 MG immediate release tablet Commonly known as: Oxy IR/ROXICODONE Take 1 every 6 hours as needed for pain not  relieved by plain Tylenol/acetaminophen.      Follow-up Information    [facility] [place] Follow up on 11/05/2019.   Specialty: General Surgery Why: Your appointment is at 2 PM.  This is for staple removal.  Be at the office 30 minutes early for check-in, bring insurance information and photo ID. Contact information: [address] [place] [address] [phone]        Ralene Ok, MD Follow up on 11/11/2019.   Specialty: General Surgery Why: Your appointment is at 10 AM.  Be at the office 30 minutes early for check-in, bring photo ID and insurance information. Contact information: [address]  [place] [address] [phone]           Signed: Earnstine Regal 10/28/2019, 11:51 AM

## 2019-10-28 NOTE — Progress Notes (Addendum)
6 Days Post-Op    CC: Abdominal pain  Subjective: Patient says he had several bowel movements yesterday tolerating full liquids well.  There is been a death in his family and he says he needs to get out to be with his grandson.  He seems fairly coherent this AM.  Midline incision looks good.  Objective: Vital signs in last 24 hours: Temp:  [97.8 F (36.6 C)-98.5 F (36.9 C)] 97.8 F (36.6 C) (02/25 0634) Pulse Rate:  [70-108] 108 (02/25 0634) Resp:  [16-18] 17 (02/25 0634) BP: (156-188)/(65-78) 156/76 (02/25 0634) SpO2:  [93 %-100 %] 93 % (02/25 0634) Last BM Date: 10/27/19 1400 p.o. 795 IV 100 urine BM recorded Afebrile vital signs are stable CMP essentially normal IntaKe/Output from previous day: 02/24 0701 - 02/25 0700 In: 2195.3 [P.O.:1400; I.V.:795.3] Out: 100 [Urine:100] Intake/Output this shift: No intake/output data recorded.  General appearance: alert, cooperative and no distress Resp: Few rales in the base. Cardio: Regular rate and rhythm GI: Soft, still sore, midline incision looks good. Extremities: extremities normal, atraumatic, no cyanosis or edema  Lab Results:  No results for input(s): WBC, HGB, HCT, PLT in the last 72 hours.  BMET Recent Labs    10/27/19 0413 10/28/19 0430  NA 142 142  K 4.2 4.5  CL 109 105  CO2 25 26  GLUCOSE 122* 126*  BUN 23 22  CREATININE 0.85 0.78  CALCIUM 8.6* 9.0   PT/INR No results for input(s): LABPROT, INR in the last 72 hours.  Recent Labs  Lab 10/22/19 0048 10/25/19 0438 10/28/19 0430  AST 15 12* 14*  ALT 18 14 16   ALKPHOS 76 75 73  BILITOT 1.0 1.5* 1.0  PROT 7.6 5.5* 6.3*  ALBUMIN 4.3 2.7* 3.1*     Lipase     Component Value Date/Time   LIPASE 21 10/22/2019 0048     Medications: . acetaminophen  1,000 mg Oral Q8H  . Chlorhexidine Gluconate Cloth  6 each Topical Daily  . enoxaparin (LOVENOX) injection  40 mg Subcutaneous Q24H  . famotidine  20 mg Oral QHS  . feeding supplement (ENSURE  ENLIVE)  237 mL Oral TID BM  . methocarbamol  500 mg Oral TID  . 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 May just be typical post op delirium given his age. Will watch, but nothing to do at this point.  FEN -TPN/full liquids VTE -Lovenox ID -none currently Pain: IV Tylenol x1, Dilaudid x1 IV Robaxin x1, Protonix 40 mg IV x2 oxycodone 5 mg x 1  Plan: Advance his diet wean off TNA, dry and get him out later today, early afternoon.    11:45 AM He has eaten and is dressed to leave. Did well with soft diet. Reviewed instructions and he is ready to go.      LOS: 6 days    Geraldine Tesar 10/28/2019 Please see Amion

## 2019-10-28 NOTE — Discharge Instructions (Addendum)
[facility]      [facility] [place] [phone]  OPEN ABDOMINAL SURGERY: POST OP INSTRUCTIONS  Always review your discharge instruction sheet given to you by the facility where your surgery was performed.  IF YOU HAVE DISABILITY OR FAMILY LEAVE FORMS, YOU MUST BRING THEM TO THE OFFICE FOR PROCESSING.  PLEASE DO NOT GIVE THEM TO YOUR DOCTOR.  1. A prescription for pain medication may be given to you upon discharge.  Take your pain medication as prescribed, if needed.  If narcotic pain medicine is not needed, then you may take acetaminophen (Tylenol) or ibuprofen (Advil) as needed. 2. Take your usually prescribed medications unless otherwise directed. 3. If you need a refill on your pain medication, please contact your pharmacy. They will contact our office to request authorization.  Prescriptions will not be filled after 5pm or on week-ends. 4. You should follow a light diet the first few days after arrival home, such as soup and crackers, pudding, etc.unless your doctor has advised otherwise. A high-fiber, low fat diet can be resumed as tolerated.   Be sure to include lots of fluids daily. Most patients will experience some swelling and bruising on the chest and neck area.  Ice packs will help.  Swelling and bruising can take several days to resolve 5. Most patients will experience some swelling and bruising in the area of the incision. Ice pack will help. Swelling and bruising can take several days to resolve..  6. It is common to experience some constipation if taking pain medication after surgery.  Increasing fluid intake and taking a stool softener will usually help or prevent this problem from occurring.  A mild laxative (Milk of Magnesia or Miralax) should be taken according to package directions if there are no bowel movements after 48 hours. 7.  You may have steri-strips (small skin tapes) in place directly over the incision.  These strips should be left on the skin for 7-10 days.  If your  surgeon used skin glue on the incision, you may shower in 24 hours.  The glue will flake off over the next 2-3 weeks.  Any sutures or staples will be removed at the office during your follow-up visit. You may find that a light gauze bandage over your incision may keep your staples from being rubbed or pulled. You may shower and replace the bandage daily. 8. ACTIVITIES:  You may resume regular (light) daily activities beginning the next day--such as daily self-care, walking, climbing stairs--gradually increasing activities as tolerated.  You may have sexual intercourse when it is comfortable.  Refrain from any heavy lifting or straining until approved by your doctor. a. You may drive when you no longer are taking prescription pain medication, you can comfortably wear a seatbelt, and you can safely maneuver your car and apply brakes b. Return to Work: ___________________________________ 9. You should see your doctor in the office for a follow-up appointment approximately two weeks after your surgery.  Make sure that you call for this appointment within a day or two after you arrive home to insure a convenient appointment time. OTHER INSTRUCTIONS:  _____________________________________________________________ _____________________________________________________________  WHEN TO CALL YOUR DOCTOR: 1. Fever over 101.0 2. Inability to urinate 3. Nausea and/or vomiting 4. Extreme swelling or bruising 5. Continued bleeding from incision. 6. Increased pain, redness, or drainage from the incision. 7. Difficulty swallowing or breathing 8. Muscle cramping or spasms. 9. Numbness or tingling in hands or feet or around lips.  The clinic staff is available to   answer your questions during regular business hours.  Please don't hesitate to call and ask to speak to one of the nurses if you have concerns.  For further questions, please visit [email or web address]    Soft-Food Eating Plan A soft-food  eating plan includes foods that are safe and easy to chew and swallow. Your health care provider or dietitian can help you find foods and flavors that fit into this plan. Follow this plan until your health care provider or dietitian says it is safe to start eating other foods and food textures. What are tips for following this plan? General guidelines   Take small bites of food, or cut food into pieces about  inch or smaller. Bite-sized pieces of food are easier to chew and swallow.  Eat moist foods. Avoid overly dry foods.  Avoid foods that: ? Are difficult to swallow, such as dry, chunky, crispy, or sticky foods. ? Are difficult to chew, such as hard, tough, or stringy foods. ? Contain nuts, seeds, or fruits.  Follow instructions from your dietitian about the types of liquids that are safe for you to swallow. You may be allowed to have: ? Thick liquids only. This includes only liquids that are thicker than honey. ? Thin and thick liquids. This includes all beverages and foods that become liquid at room temperature.  To make thick liquids: ? Purchase a commercial liquid thickening powder. These are available at grocery stores and pharmacies. ? Mix the thickener into liquids according to instructions on the label. ? Purchase ready-made thickened liquids. ? Thicken soup by pureeing, straining to remove chunks, and adding flour, potato flakes, or corn starch. ? Add commercial thickener to foods that become liquid at room temperature, such as milk shakes, yogurt, ice cream, gelatin, and sherbet.  Ask your health care provider whether you need to take a fiber supplement. Cooking  Cook meats so they stay tender and moist. Use methods like braising, stewing, or baking in liquid.  Cook vegetables and fruit until they are soft enough to be mashed with a fork.  Peel soft, fresh fruits such as peaches, nectarines, and melons.  When making soup, make sure chunks of meat and vegetables are  smaller than  inch.  Reheat leftover foods slowly so that a tough crust does not form. What foods are allowed? The items listed below may not be a complete list. Talk with your dietitian about what dietary choices are best for you. Grains Breads, muffins, pancakes, or waffles moistened with syrup, jelly, or butter. Dry cereals well-moistened with milk. Moist, cooked cereals. Well-cooked pasta and rice. Vegetables All soft-cooked vegetables. Shredded lettuce. Fruits All canned and cooked fruits. Soft, peeled fresh fruits. Strawberries. Dairy Milk. Cream. Yogurt. Cottage cheese. Soft cheese without the rind. Meats and other protein foods Tender, moist ground meat, poultry, or fish. Meat cooked in gravy or sauces. Eggs. Sweets and desserts Ice cream. Milk shakes. Sherbet. Pudding. Fats and oils Butter. Margarine. Olive, canola, sunflower, and grapeseed oil. Smooth salad dressing. Smooth cream cheese. Mayonnaise. Gravy. What foods are not allowed? The items listed bemay not be a complete list. Talk with your dietitian about what dietary choices are best for you. Grains Coarse or dry cereals, such as bran, granola, and shredded wheat. Tough or chewy crusty breads, such as [place] bread or baguettes. Breads with nuts, seeds, or fruit. Vegetables All raw vegetables. Cooked corn. Cooked vegetables that are tough or stringy. Tough, crisp, fried potatoes and potato skins. Fruits Fresh fruits with skins or  seeds, or both, such as apples, pears, and grapes. Stringy, high-pulp fruits, such as papaya, pineapple, coconut, and mango. Fruit leather and all dried fruit. Dairy Yogurt with nuts or coconut. Meats and other protein foods Hard, dry sausages. Dry meat, poultry, or fish. Meats with gristle. Fish with bones. Fried meat or fish. Lunch meat and hotdogs. Nuts and seeds. Chunky peanut butter or other nut butters. Sweets and desserts Cakes or cookies that are very dry or chewy. Desserts with dried  fruit, nuts, or coconut. Fried pastries. Very rich pastries. Fats and oils Cream cheese with fruit or nuts. Salad dressings with seeds or chunks. Summary  A soft-food eating plan includes foods that are safe and easy to swallow. Generally, the foods should be soft enough to be mashed with a fork.  Avoid foods that are dry, hard to chew, crunchy, sticky, stringy, or crispy.  Ask your health care provider whether you need to thicken your liquids and if you need to take a fiber supplement. This information is not intended to replace advice given to you by your health care provider. Make sure you discuss any questions you have with your health care provider. Document Revised: 12/10/2018 Document Reviewed: 10/22/2016 Elsevier Patient Education  2020 Else[employer] Inc.  Low Fiber Nutrition Therapy   You may need a low-fiber diet if you have Crohn's disease, diverticulitis, gastroparesis, ulcerative colitis, a new colostomy, or new ileostomy. A low-fiber diet may also be needed following radiation therapy to the pelvis and lower bowel or recent intestinal surgery.  A low-fiber diet reduces the frequency and volume of your stools. This lessens irritation to the gastrointestinal (GI) tract and can help you heal. Use this diet if you have a stricture so your intestine doesn't get blocked. The goal of this diet is to get less than 8 grams of fiber daily. It's also important to eat enough protein foods while you are on a low-fiber diet.  Drink nutrition supplements that have 1 gram of fiber or less in each serving. If your stricture is severe or if your inflammation is severe, drink more liquids to reduce symptoms and to get enough calories and protein.  Tips . Eat about 5 to 6 small meals daily or about every 3 to 4 hours. Do not skip meals.  . Every time you eat, include a small amount of protein (1 to 2 ounces) plus an additional food. Low fiber starch foods are the best choice to eat with protein.   . Limit acidic, spicy and high-fat or fried and greasy foods to reduce GI symptoms.  . Do not eat raw fruits and vegetables while on this diet. All fruits and vegetables need to be cooked and without peels or skins.  . Drink a lot of fluids, at least 8 cups of fluid each day. Limit drinks with caffeine, sugar, and sugar substitutes.  . Plain water is the best choice. Avoid mixing drink packets or flavor drops into water. .  . Take a chewable multivitamin with minerals. Gummy vitamins do not have enough minerals and can block an ostomy and non-chewable supplements are not easily digested. Chewable supplements must be used if you have a stricture or ostomy.  . If you are lactose intolerant, you may need to eat low-lactose dairy products. If you can't tolerate dairy, ask your RDN about how you can get enough calcium from other foods.  . Do not take a calcium supplement. They can cause a blockage.  . It is important to add  high-calcium foods gradually to your diet and monitor for symptoms to avoid a blockage.  . Do not add more fiber to your diet until your health care provider or registered dietitian nutritionist (RDN) tells you it's OK. Fiber is part of whole grains, fruits and vegetables (foods from plants) and needs to be slowly added back in to your diet when your body is healed.  . Choose foods that have been safely handled and prepared to lower your risk of foodborne illness. Talk to your RDN or see the Food Safety Nutrition Therapy handout for more information.   Foods Recommended These foods are low in fat and fiber and will help with your GI symptoms. Food Group Foods Recommended  Grains  Choose grain foods with less than 2 grams of fiber per serving. Refined white flour products--for example, enriched white bread without seeds, crackers or pasta Cream of wheat or rice Grits (fine ground) Tortillas: white flour or corn White rice, well-cooked (do not rinse, or soak before cooking) Cold  and hot cereals made from white or refined flour such as puffed rice or corn flakes  Protein Foods  Lean, very tender, well-cooked poultry or fish; red meats: beef, pork or lamb (slow cook until soft; chop meats if you have stricture or ostomy) Eggs, well-cooked Smooth nut butters such as almond, peanut, or sunflower Tofu  Dairy  If you have lactose intolerance, drinking milk products from cows or goats may make diarrhea worse. Foods marked with an asterisk (*) have lactose. Milk: fat-free, 1% or 2% * (choose best tolerated) Lactose-free milk Buttermilk* Fortified non-dairy milks: almond, cashew, coconut, or rice (be aware that these options are not good sources of protein so you will need to eat an additional protein food) Kefir* (Don't include kefir in the diet until approved by your health care provider) Yogurt*/lactose-free yogurt (without nuts, fruit, granola or chocolate) Mild cheese* (hard and aged cheeses tend to be lower in lactose such as cheddar, swiss or parmesan) Cottage cheese* or lactose-free cottage cheese Low-fat ice cream* or lactose-free ice cream Sherbet* (usually lower lactose)  Vegetables  Canned and well-cooked vegetables without seeds, skins, or hulls  Carrots or green beans, cooked White, red or yellow potatoes without skins Strained vegetable juice  Fruit Soft, and well-cooked fruits without skins, seeds, or membranes Canned fruit in juice: peaches, pears, or applesauce Fruit juice without pulp diluted by half with water may be tolerated better Fruit drinks fortified with vitamin C may be tolerated better than 100% fruit juice  Oils  When possible, choose healthy oils and fats, such as olive and canola oils, plant oils rather than solid fats.  Other  Broth and strained soups made from allowed foods Desserts (small portions) without whole grains, seeds, nuts, raisins, or coconut Jelly (clear)   Foods Not Recommended These foods are higher in fat and fiber and  may make your GI symptoms worse.  Food Group Foods Not Recommended  Grains  Bread, whole wheat or with whole grain flour or seeds or nuts Brown rice, quinoa, kasha, barley Tortillas: whole grain Whole wheat pasta Whole grain and high-fiber cereals, including oatmeal, bran flakes or shredded wheat Popcorn  Protein Foods  Steak, pork chops, or other meats that are fatty or have gristle Fried meat, poultry, or fish Seafood with a tough or rubbery texture, such as shrimp Luncheon meats such as bologna and salami Sausage, bacon, or hot dogs Dried beans, peas, or lentils Hummus Sushi Nuts and chunky nut butters  Dairy  Whole milk Pea milk and soymilk (may cause diarrhea, gas, bloating, and abdominal pain) Cream Half-and-half Sour cream Yogurt with added fruit, nuts, or granola or chocolate  Vegetables  Alfalfa or bean sprouts (high fiber and risk for bacteria) Raw or undercooked vegetables: beets; broccoli; brussels sprouts; cabbage; cauliflower; collard, mustard, or turnip greens; corn; cucumber; green peas or any kind of peas; kale; lima beans; mushrooms; okra; olives; pickles and relish; onions; parsnips; peppers; potato skins; sauerkraut; spinach; tomatoes  Fruit Raw fruit Dried fruit Avocado, berries, coconut Canned fruit in syrup Canned fruit with mandarin oranges, papaya or pineapple Fruit juice with pulp Prune juice Fruit skin  Oils  Pork rinds   Low-Fiber (8 grams) Sample 1-Day Menu  Breakfast  cup cream of wheat (0.5 gram fiber)  1 slice white toast (1 gram fiber)  1 teaspoon margarine, soft tub  2 scrambled eggs   Morning Snack 1 cup lactose-free nutrition supplement  Lunch 2 slices white bread (2 grams fiber)  3 tablespoons tuna  1 tablespoon mayonnaise  1 cup chicken noodle soup (1 gram fiber)   cup apple juice   Afternoon Snack 6 saltine crackers (0.5 gram fiber)  2 ounces low-fat cheddar cheese  Evening Meal 3 ounces tender chicken breast  1 cup white rice  (0.5 gram fiber)   cup cooked canned green beans (2 grams fiber)   cup cranberry juice   Evening Snack 1 cup lactose-free nutrition supplement   Copyright 2020  Academy of [employer] and Dietetics

## 2019-12-16 ENCOUNTER — Telehealth (INDEPENDENT_AMBULATORY_CARE_PROVIDER_SITE_OTHER): Payer: Self-pay | Admitting: Internal Medicine

## 2019-12-16 NOTE — Telephone Encounter (Signed)
Patient called stated it is imperative that he sees Dr Karilyn Cota - states every time he eats anything it goes straight thru him - very adament that he has an appointment soon - states he can not wait until next appointment which is on 5/25  -  Please advise - ph# [phone]

## 2019-12-16 NOTE — Telephone Encounter (Signed)
Tried to call the patient voicemail - unable to leave a message , mailbox full. Will attempt again morning of 12/17/2019.

## 2019-12-16 NOTE — Telephone Encounter (Signed)
Per Dr.Rehman the patient may take Imodium 2 mg - He is to take one 30 minutes before breakfast and another one 30 minutes before lunch.  Patient called and made aware.

## 2019-12-17 NOTE — Telephone Encounter (Signed)
Patient was called and given the instructions per Dr.Rehman. He was made aware that our office would call with appointment time for Tuesday either today or Monday.

## 2019-12-17 NOTE — Telephone Encounter (Signed)
Mitzie is going to move a patient off Tuesday and add him.

## 2019-12-20 ENCOUNTER — Other Ambulatory Visit: Payer: Self-pay

## 2019-12-20 ENCOUNTER — Ambulatory Visit (INDEPENDENT_AMBULATORY_CARE_PROVIDER_SITE_OTHER): Payer: Medicare Other | Admitting: Internal Medicine

## 2019-12-20 ENCOUNTER — Encounter (INDEPENDENT_AMBULATORY_CARE_PROVIDER_SITE_OTHER): Payer: Self-pay | Admitting: Internal Medicine

## 2019-12-20 VITALS — BP 165/87 | HR 87 | Temp 97.1°F | Ht 71.0 in | Wt 138.5 lb

## 2019-12-20 DIAGNOSIS — K6389 Other specified diseases of intestine: Secondary | ICD-10-CM

## 2019-12-20 DIAGNOSIS — D649 Anemia, unspecified: Secondary | ICD-10-CM

## 2019-12-20 MED ORDER — METRONIDAZOLE 250 MG PO TABS: 250.0000 mg | ORAL_TABLET | Freq: Three times a day (TID) | ORAL | 0 refills | Status: DC

## 2019-12-20 NOTE — Patient Instructions (Addendum)
Take Imodium/loperamide 2 mg 30 minutes before each meal/3 times a day Take metronidazole 250 mg by mouth after each meal for 10 days. Please call office with progress report on day 10. Physician will call with results of blood test.

## 2019-12-20 NOTE — Progress Notes (Signed)
Presenting complaint;  Bloating.  Database and subjective:  Patient is 69 year old African-American male who is here for scheduled visit.  Patient called last week and requested to be seen.  He has history of recurrent small bowel obstruction.  He was last seen on 10/19/2019 and felt to have high-grade but incomplete small bowel obstruction and he was referred to Dr. Rosendo Gros of [facility] surgery.  He had exploratory laparotomy with lysis of adhesions on 10/22/2019.  Postop course was uneventful and was discharged on 10/28/2019. Patient says she feels much better.  He is not having nausea or vomiting or abdominal pain.  He is still having diarrhea.  He is having at least 5 stools every day.  He continues to complain of bloating.  He feels he is passing too much flatus.  At times is not controlled.  He belches every now and then but not often.  His appetite is back to normal.  He has gained 6 pounds since he came to our office on 10/19/2019. He has received both doses of maternal Covid vaccine.   Current Medications: Outpatient Encounter Medications as of 12/20/2019  Medication Sig  . acetaminophen (TYLENOL) 500 MG tablet You can take 1000 mg of plain Tylenol every 6 hours as needed for pain.  You can buy this over-the-counter at any drugstore.  Do not take more than 4000 mg of Tylenol per day it can harm your liver.  . loperamide (IMODIUM) 2 MG capsule Take 2 mg by mouth as needed for diarrhea or loose stools. Patient is taking before breakfast and lunch.  . oxyCODONE (OXY IR/ROXICODONE) 5 MG immediate release tablet Take 1 every 6 hours as needed for pain not relieved by plain Tylenol/acetaminophen.  . Probiotic Product (ALIGN) 4 MG CAPS Take 4 mg by mouth daily.   No facility-administered encounter medications on file as of 12/20/2019.     Objective: Blood pressure (!) 165/87, pulse 87, temperature (!) 97.1 F (36.2 C), temperature source Temporal, height '5\' 11"'  (1.803 m), weight 138 lb 8  oz (62.8 kg). Patient is alert and in no acute distress. He is wearing a facial mask. Conjunctiva is pink. Sclera is nonicteric Oropharyngeal mucosa is normal. No neck masses or thyromegaly noted. Cardiac exam with regular rhythm normal S1 and S2. No murmur or gallop noted. Lungs are clear to auscultation. Abdomen is full but not distended like on his last visit.  He has long midline scar.  Bowel sounds are hyperactive.  On palpation abdomen is soft and nontender with organomegaly or masses.  Percussion note is weightbearing. No LE edema or clubbing noted.  Labs/studies Results:  CBC Latest Ref Rng & Units 10/25/2019 10/23/2019 10/22/2019  WBC 4.0 - 10.5 K/uL 9.8 9.0 10.1  Hemoglobin 13.0 - 17.0 g/dL 11.4(L) 12.7(L) 15.3  Hematocrit 39.0 - 52.0 % 33.5(L) 37.9(L) 45.1  Platelets 150 - 400 K/uL 150 201 224    CMP Latest Ref Rng & Units 10/28/2019 10/27/2019 10/26/2019  Glucose 70 - 99 mg/dL 126(H) 122(H) 119(H)  BUN 8 - 23 mg/dL '22 23 22  ' Creatinine 0.61 - 1.24 mg/dL 0.78 0.85 0.90  Sodium 135 - 145 mmol/L 142 142 143  Potassium 3.5 - 5.1 mmol/L 4.5 4.2 3.8  Chloride 98 - 111 mmol/L 105 109 106  CO2 22 - 32 mmol/L '26 25 28  ' Calcium 8.9 - 10.3 mg/dL 9.0 8.6(L) 8.6(L)  Total Protein 6.5 - 8.1 g/dL 6.3(L) - -  Total Bilirubin 0.3 - 1.2 mg/dL 1.0 - -  Alkaline Phos 38 - 126 U/L 73 - -  AST 15 - 41 U/L 14(L) - -  ALT 0 - 44 U/L 16 - -    Hepatic Function Latest Ref Rng & Units 10/28/2019 10/25/2019 10/22/2019  Total Protein 6.5 - 8.1 g/dL 6.3(L) 5.5(L) 7.6  Albumin 3.5 - 5.0 g/dL 3.1(L) 2.7(L) 4.3  AST 15 - 41 U/L 14(L) 12(L) 15  ALT 0 - 44 U/L '16 14 18  ' Alk Phosphatase 38 - 126 U/L 73 75 76  Total Bilirubin 0.3 - 1.2 mg/dL 1.0 1.5(H) 1.0     Assessment:  #1.  Bloating and excessive flatus most likely secondary to small intestinal bacterial overgrowth resulting from marked dilation to small bowel well documented on CT of 10/15/2019.  I believe this should gradually improved.  #2.   Anemia.  Review of lab studies reveal hemoglobin of 11.4 per 2 months ago.  Will repeat H&H and go from there.    Plan:  Metronidazole to 50 mg by mouth 3 times a day for 10 days. Patient will continue loperamide OTC 3 times a day before each meal.  If stool frequency decreases he can back off to 1 or 2 doses a day. Patient will go to the lab for CBC and serum albumin. Patient advised to call office with progress report when he finishes antibiotic.

## 2019-12-21 ENCOUNTER — Encounter: Payer: Self-pay | Admitting: Sports Medicine

## 2019-12-21 ENCOUNTER — Other Ambulatory Visit: Payer: Self-pay

## 2019-12-21 ENCOUNTER — Ambulatory Visit (INDEPENDENT_AMBULATORY_CARE_PROVIDER_SITE_OTHER): Payer: Medicare Other | Admitting: Sports Medicine

## 2019-12-21 ENCOUNTER — Ambulatory Visit: Payer: Medicare Other

## 2019-12-21 VITALS — BP 165/68 | HR 68 | Temp 97.6°F | Resp 16

## 2019-12-21 DIAGNOSIS — B351 Tinea unguium: Secondary | ICD-10-CM

## 2019-12-21 DIAGNOSIS — M79675 Pain in left toe(s): Secondary | ICD-10-CM

## 2019-12-21 DIAGNOSIS — M79674 Pain in right toe(s): Secondary | ICD-10-CM | POA: Diagnosis not present

## 2019-12-21 DIAGNOSIS — M79671 Pain in right foot: Secondary | ICD-10-CM

## 2019-12-21 LAB — CBC
HCT: 34.1 % — ABNORMAL LOW (ref 38.5–50.0)
Hemoglobin: 11.4 g/dL — ABNORMAL LOW (ref 13.2–17.1)
MCH: 33.7 pg — ABNORMAL HIGH (ref 27.0–33.0)
MCHC: 33.4 g/dL (ref 32.0–36.0)
MCV: 100.9 fL — ABNORMAL HIGH (ref 80.0–100.0)
MPV: 11.6 fL (ref 7.5–12.5)
Platelets: 155 10*3/uL (ref 140–400)
RBC: 3.38 10*6/uL — ABNORMAL LOW (ref 4.20–5.80)
RDW: 13.7 % (ref 11.0–15.0)
WBC: 5.3 10*3/uL (ref 3.8–10.8)

## 2019-12-21 LAB — ALBUMIN: Albumin: 3.6 g/dL (ref 3.6–5.1)

## 2019-12-21 NOTE — Progress Notes (Signed)
Subjective: Stuart Watts is a 69 y.o. male patient seen today in office with complaint of mildly painful thickened and discolored nails. Patient is desiring treatment for nail changes; has tried OTC topicals/Medication in the past with no improvement. Reports that nails are becoming difficult to manage because of the thickness. Reports that he has GI issues and was found to have a blockage and an issue with scar that cause swelling but no pain. Reports that he is trying to increase his foot intake to gain wait and to watch his salt to avoid swelling in ankles. Patient has no other pedal complaints at this time.   Review of Systems  All other systems reviewed and are negative.   Patient Active Problem List   Diagnosis Date Noted  . Anemia 12/20/2019  . Protein-calorie malnutrition, severe 10/28/2019  . 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 Protein-calorie malnutrition, moderate (HCC) 10/16/2019  . Noncompliance with NG tube 10/16/2019  . History of small bowel obstruction 10/16/2019  . SBO (small bowel obstruction) (HCC) 10/15/2019    Current Outpatient Medications on File Prior to Visit  Medication Sig Dispense Refill  . loperamide (IMODIUM) 2 MG capsule Take 2 mg by mouth as needed for diarrhea or loose stools. Patient is taking before breakfast and lunch.    . metroNIDAZOLE (FLAGYL) 250 MG tablet Take 1 tablet (250 mg total) by mouth 3 (three) times daily. 30 tablet 0  . Probiotic Product (ALIGN) 4 MG CAPS Take 4 mg by mouth daily.     No current facility-administered medications on file prior to visit.    No Known Allergies  Objective: Physical Exam  General: Well developed, nourished, no acute distress, awake, alert and oriented x 3  Vascular: Dorsalis pedis artery 2/4 bilateral, Posterior tibial artery 1/4 bilateral, skin temperature warm to warm proximal to distal bilateral lower extremities, no varicosities, pedal  hair present bilateral.  Neurological: Gross sensation present via light touch bilateral.   Dermatological: Skin is warm, dry, and supple bilateral, Nails 1-10 are tender, short thick, and discolored with mild subungal debris with bilateral hallux nails most involved, no webspace macerations present bilateral, no open lesions present bilateral, no callus/corns/hyperkeratotic tissue present bilateral. No signs of infection bilateral.  Musculoskeletal: No boney deformities noted bilateral. Muscular strength within normal limits without painon range of motion. No pain with calf compression bilateral.  Assessment and Plan:  Problem List Items Addressed This Visit    None    Visit Diagnoses    Onychomycosis    -  Primary   Relevant Orders   Culture, fungus without smear   Toe pain, bilateral          -Examined patient -Discussed treatment options for painful dystrophic nails  -Fungal culture was obtained by removing a portion of the hard nail itself from each of the involved toenails using a sterile nail nipper and sent to [facility] lab. Patient tolerated the biopsy procedure well without discomfort or need for anesthesia.  -Patient to return in 4 weeks for follow up evaluation and discussion of fungal culture results or sooner if symptoms worsen.  Asencion Islam, DPM

## 2019-12-22 ENCOUNTER — Other Ambulatory Visit (INDEPENDENT_AMBULATORY_CARE_PROVIDER_SITE_OTHER): Payer: Self-pay | Admitting: *Deleted

## 2019-12-22 DIAGNOSIS — D649 Anemia, unspecified: Secondary | ICD-10-CM

## 2019-12-22 NOTE — Addendum Note (Signed)
Addended by: Fritz Pickerel A on: 12/22/2019 08:15 AM   Modules accepted: Orders

## 2019-12-28 ENCOUNTER — Other Ambulatory Visit (INDEPENDENT_AMBULATORY_CARE_PROVIDER_SITE_OTHER): Payer: Self-pay | Admitting: *Deleted

## 2019-12-28 DIAGNOSIS — D649 Anemia, unspecified: Secondary | ICD-10-CM

## 2020-01-04 ENCOUNTER — Telehealth (INDEPENDENT_AMBULATORY_CARE_PROVIDER_SITE_OTHER): Payer: Self-pay | Admitting: Internal Medicine

## 2020-01-04 NOTE — Telephone Encounter (Signed)
Patient called stated he is feeling better - meds are working - stated he has 3 pills left of the antibiotic - would like to know if he is to continue the antibiotic and also has questions about blood pressure meds - please advise - ph# [phone]

## 2020-01-05 NOTE — Telephone Encounter (Signed)
Return call to the patient .  A SMS message was sent.

## 2020-01-05 NOTE — Telephone Encounter (Signed)
Per Dr.Rehman if the patient is is a lot better will not need to continue the antibiotic. If patient is 50% better we will renew the prescription for 10 more days.Marland Kitchen

## 2020-01-06 ENCOUNTER — Other Ambulatory Visit (INDEPENDENT_AMBULATORY_CARE_PROVIDER_SITE_OTHER): Payer: Self-pay | Admitting: *Deleted

## 2020-01-06 DIAGNOSIS — K6389 Other specified diseases of intestine: Secondary | ICD-10-CM

## 2020-01-06 MED ORDER — METRONIDAZOLE 250 MG PO TABS: 250.0000 mg | ORAL_TABLET | Freq: Three times a day (TID) | ORAL | 0 refills | Status: AC

## 2020-01-06 NOTE — Telephone Encounter (Signed)
Talked with the patient . He says has sick sick as he has been , he feels so much better, but he would like the refill of Flagyl to insure that the infection is all gone. This was sent to the patient's pharmacy, [facility] [place].

## 2020-01-18 ENCOUNTER — Ambulatory Visit: Payer: Medicare Other | Admitting: Sports Medicine

## 2020-01-25 ENCOUNTER — Ambulatory Visit (INDEPENDENT_AMBULATORY_CARE_PROVIDER_SITE_OTHER): Payer: Medicare (Managed Care) | Admitting: Internal Medicine

## 2020-12-14 IMAGING — CT CT ABD-PELV W/ CM
2 of 5 series · 15 of 46 positions shown, 17 images · IV contrast (APPLIED)
Comparison: October 14, 2019

CLINICAL DATA: High-grade partial small bowel obstruction

EXAM:
CT ABDOMEN AND PELVIS WITH CONTRAST
TECHNIQUE: Multidetector CT imaging of the abdomen and pelvis was performed
using the standard protocol following bolus administration of
intravenous contrast.
CONTRAST:  100mL OMNIPAQUE IOHEXOL 300 MG/ML  SOLN

[Series 3: abdomen 5.0 · axial · 0.82mm/px · z∈[-647,-197]mm · 12 of 102 slices shown, 14 images]
[im 6/102  soft-tissue]
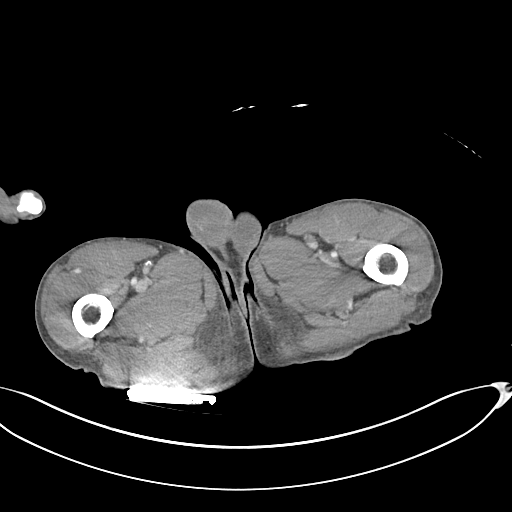
[im 6/102  bone]
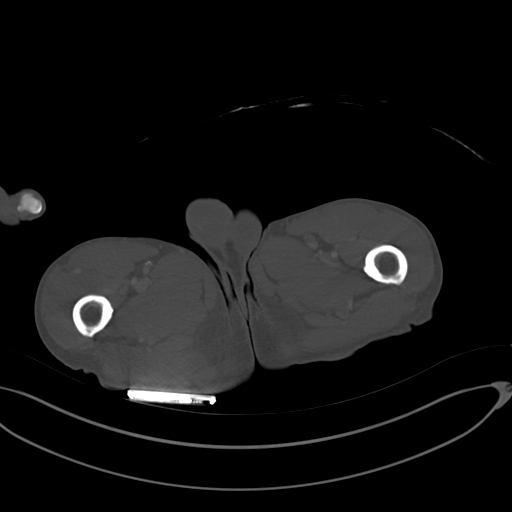
[im 18/102  soft-tissue]
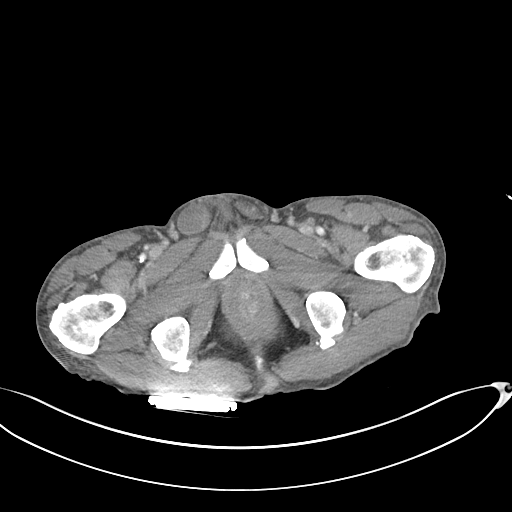
[im 24/102  soft-tissue]
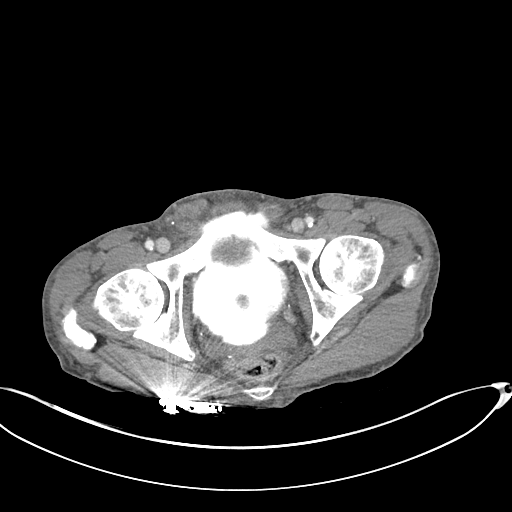
[im 30/102  soft-tissue]
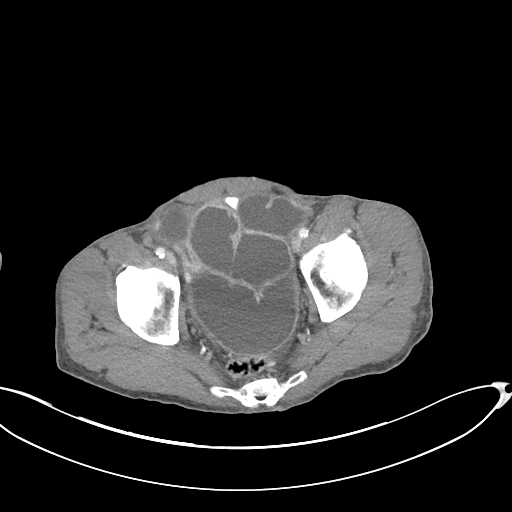
[im 42/102  soft-tissue]
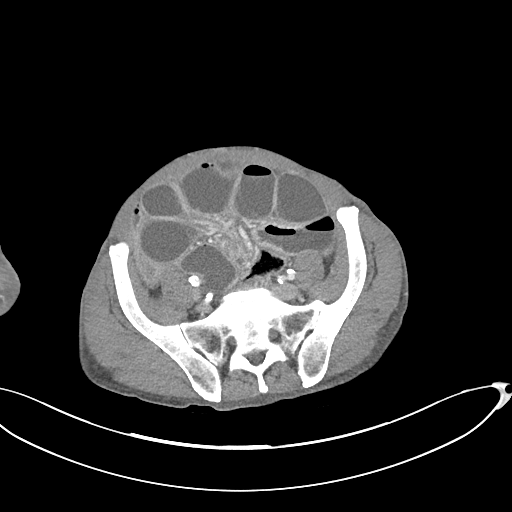
[im 48/102  soft-tissue]
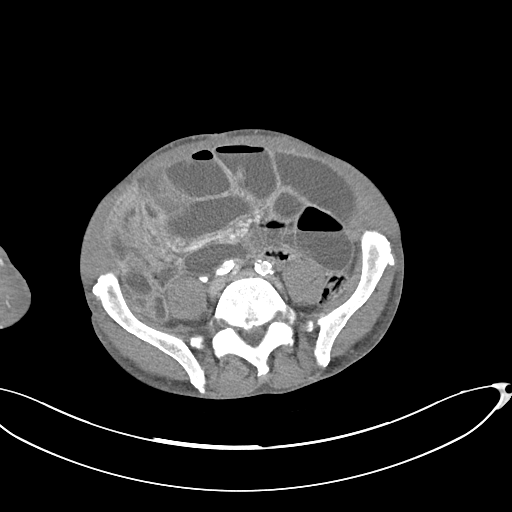
[im 54/102  soft-tissue]
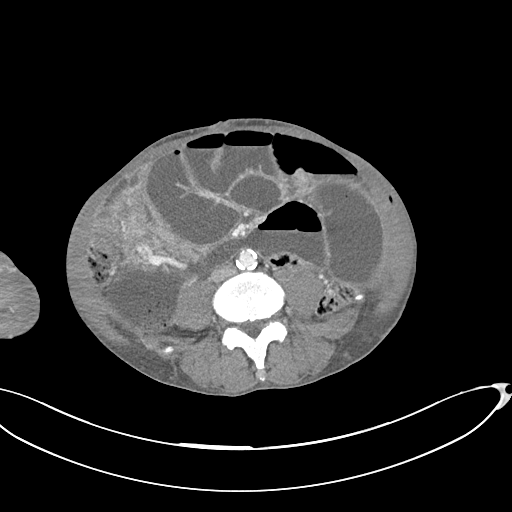
[im 66/102  soft-tissue]
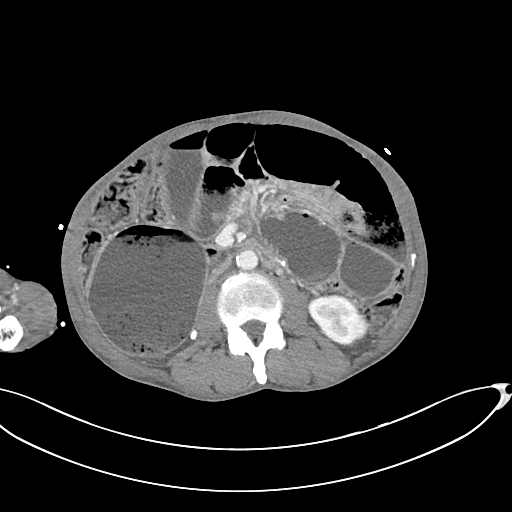
[im 72/102  soft-tissue]
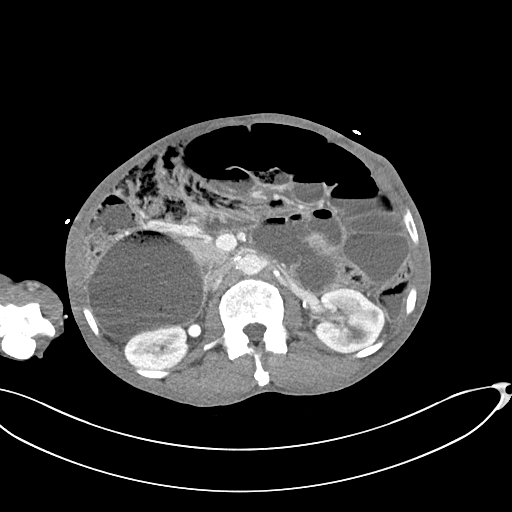
[im 72/102  bone]
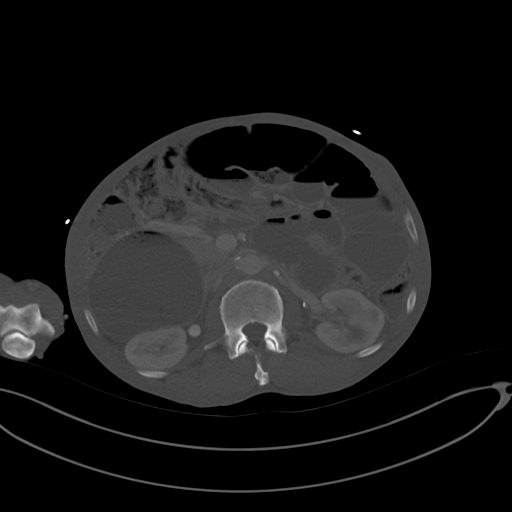
[im 78/102  soft-tissue]
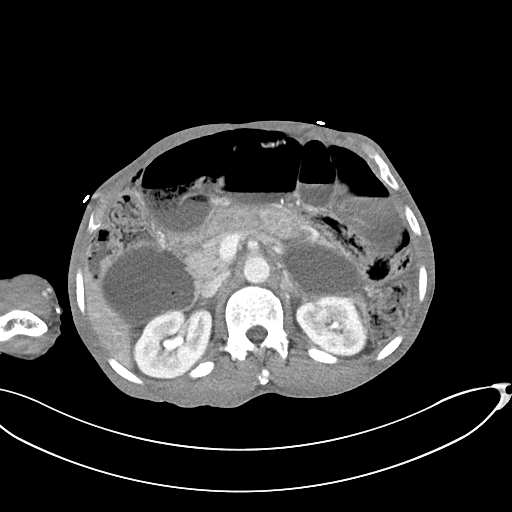
[im 90/102  soft-tissue]
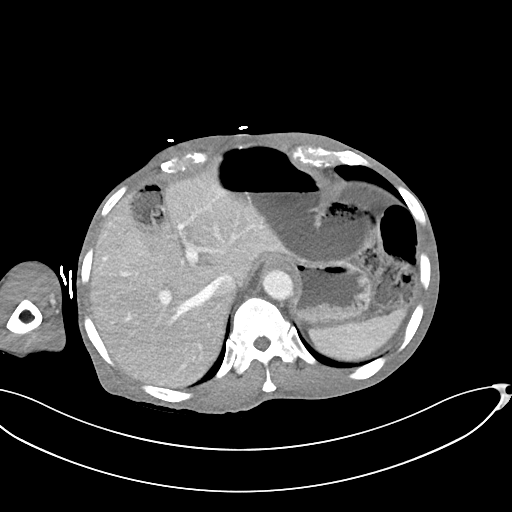
[im 96/102  soft-tissue]
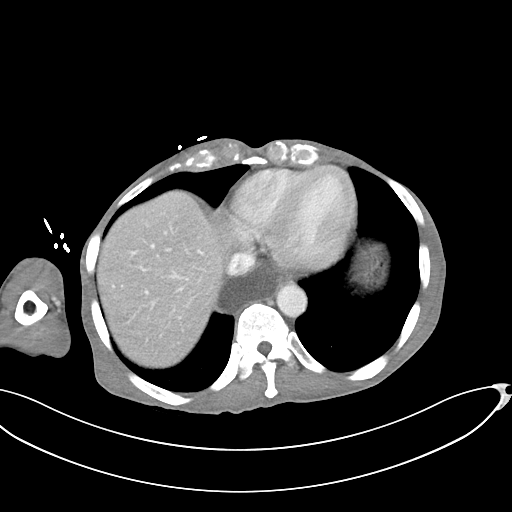

[Series 6: abdomen 3.0 mpr cor · coronal · 0.81mm/px · 3 of 84 slices shown]
[im 28/84  soft-tissue]
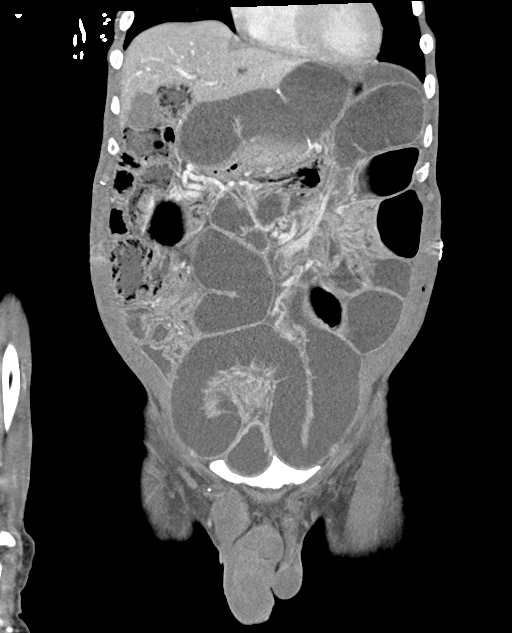
[im 37/84  soft-tissue]
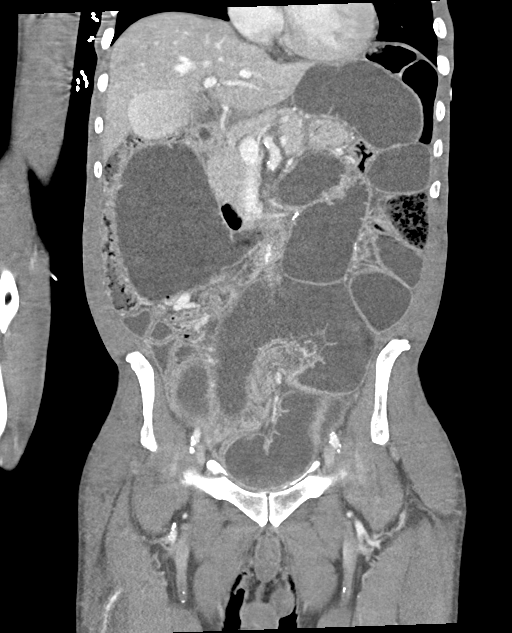
[im 47/84  soft-tissue]
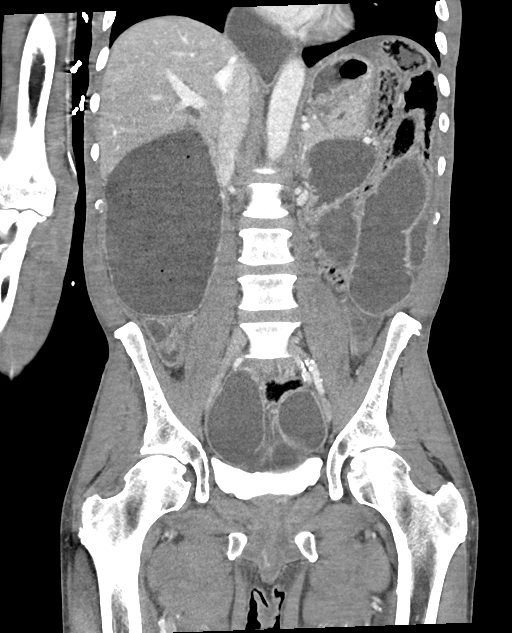

[15 of 46 positions shown; findings below may reference images not displayed]

FINDINGS: Lower chest: The visualized heart size within normal limits. No
pericardial fluid/thickening.

Again noted is a dilated distal esophagus contains fluid.

Small bullous change seen at the left lung base.

Hepatobiliary: Scattered small hypodense lesions are seen throughout
the liver parenchyma the largest measuring 6 mm in the anterior left
liver lobe.The main portal vein is patent. Contrast excretion seen
within the gallbladder.

Pancreas: Unremarkable. No pancreatic ductal dilatation or
surrounding inflammatory changes.

Spleen: Normal in size without focal abnormality.

Adrenals/Urinary Tract: Both adrenal glands appear normal. Scattered
low-density lesions are seen throughout both kidneys the largest
measuring 2.5 cm within the midpole of the left kidney. No
hydronephrosis. The bladder is contrast filled and unremarkable.

Stomach/Bowel: The stomach appears to be partially decompressed in
comparison to the prior. Again noted is markedly dilated air, fluid,
and debris filled small bowel seen throughout measuring up to
cm, previously up to 10 cm. There is been a prior surgical
anastomosis seen within the left mid abdomen. The dilated loops are
down to the level of the distal ileal loops which appear to be
partially decompressed. No focal transition point however is noted.
There is air and stool seen within the colon. There is mild question
swirling of the mid mesentery at the mesenteric root, series 3,
image 38. This was seen on prior and not changed in appearance.

Vascular/Lymphatic: There are no enlarged mesenteric,
retroperitoneal, or pelvic lymph nodes. Scattered aortic
atherosclerotic calcifications are seen without aneurysmal
dilatation.

Reproductive: The prostate is unremarkable.

Other: No evidence of abdominal wall mass or hernia. Trace free
fluid seen within the deep pelvis.

Musculoskeletal: No acute or significant osseous findings.
IMPRESSION: 1. Again findings of a high-grade partial small bowel obstruction,
slightly improved in appearance from the prior exam without a focal
transition point. There does appear to be some mild swirling of the
mid mesentery and this could be due to adhesions.
2. Trace free fluid in the deep pelvis.
3. Mildly dilated fluid filled distal esophagus.
4. Aortic Atherosclerosis ([id number]).

## 2020-12-21 IMAGING — DX DG ABD PORTABLE 1V
1 series · 1 of 1 positions shown · non-contrast
Comparison: None.

CLINICAL DATA: Nasogastric tube placement

EXAM:
PORTABLE ABDOMEN - 1 VIEW

[abdomen kub]
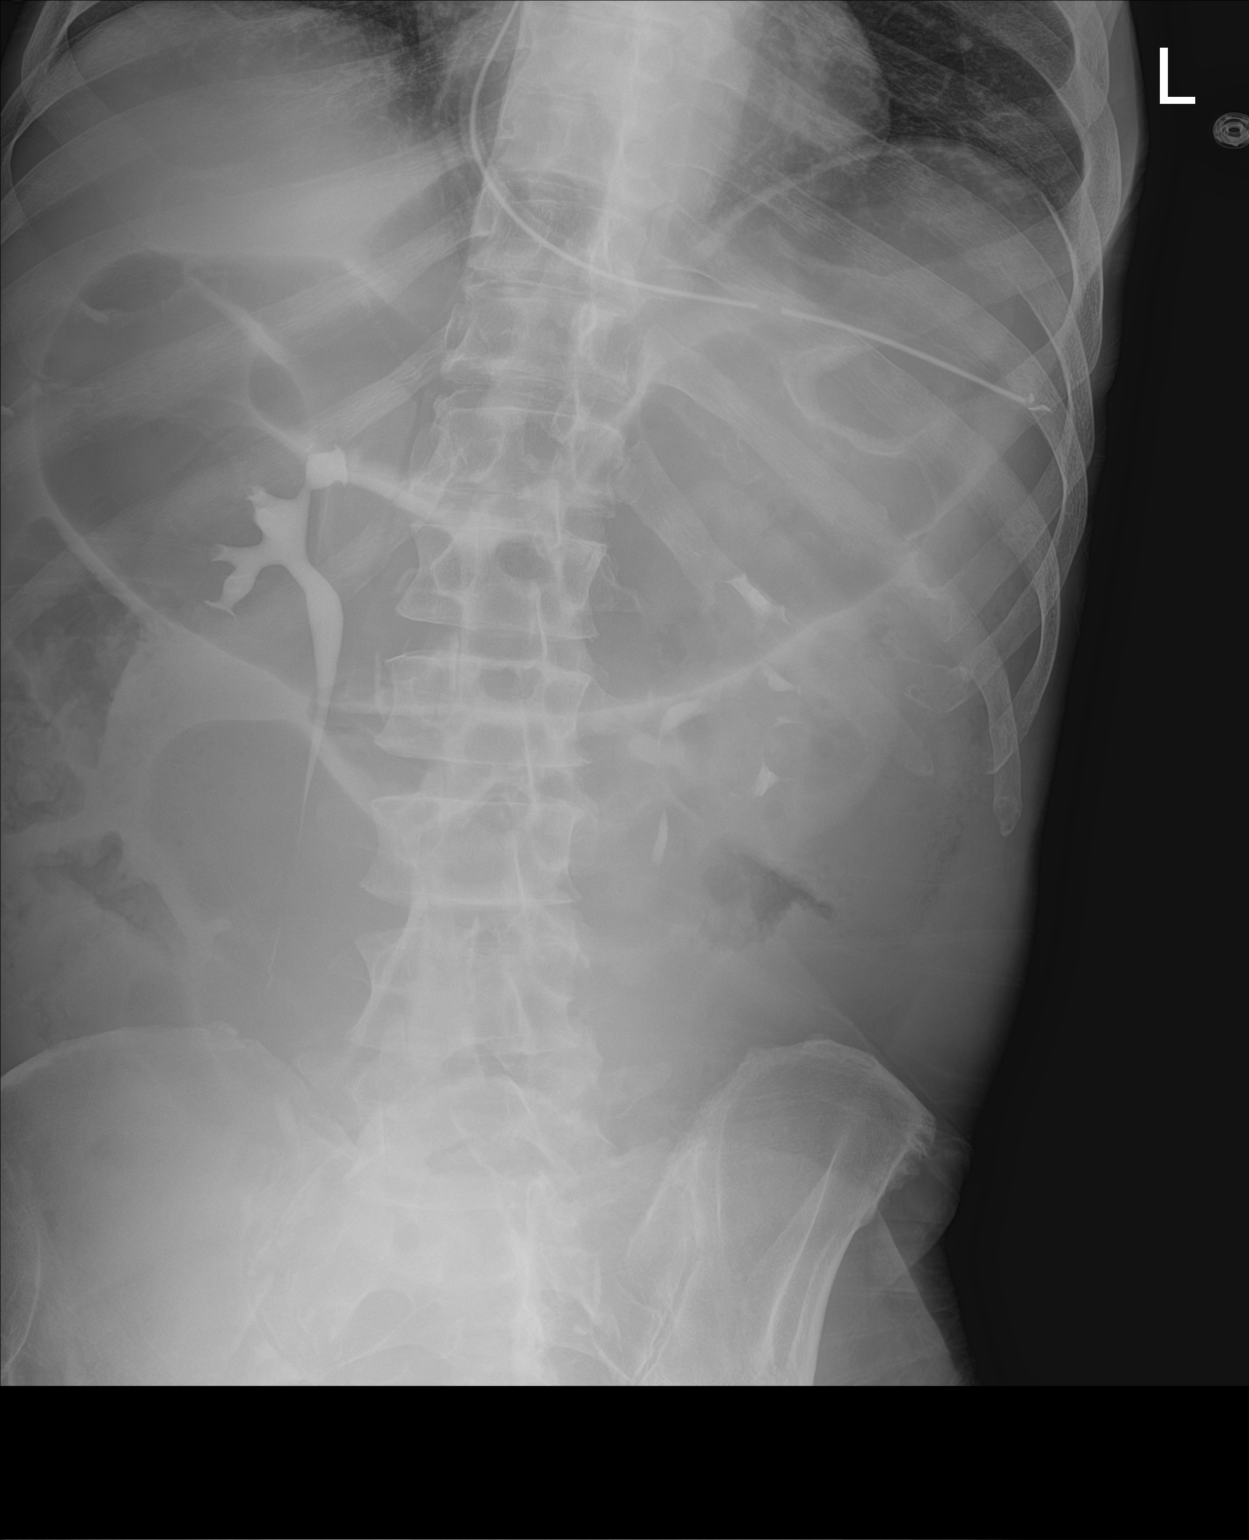

[1 of 1 positions shown; findings below may reference images not displayed]

FINDINGS: Nasogastric tube tip and side port project over the stomach. There
is excreted contrast material seen within the renal pelvises and
ureters. Multiple loops of distended bowel, with gas filled stomach.
IMPRESSION: Nasogastric tube tip and side port project in the stomach.

## 2021-12-31 DEATH — deceased
